# Patient Record
Sex: Male | Born: 1965 | Race: White | Hispanic: No | Marital: Married | Smoking: Current every day smoker
Health system: Southern US, Community
[De-identification: ages and names within clinical notes are randomized; demographics above are authoritative.]

## PROBLEM LIST (undated history)

## (undated) DIAGNOSIS — G4730 Sleep apnea, unspecified: Secondary | ICD-10-CM

## (undated) DIAGNOSIS — E119 Type 2 diabetes mellitus without complications: Secondary | ICD-10-CM

## (undated) HISTORY — PX: WISDOM TOOTH EXTRACTION: SHX21

## (undated) HISTORY — PX: NOSE SURGERY: SHX723

## (undated) HISTORY — DX: Sleep apnea, unspecified: G47.30

## (undated) HISTORY — PX: TONSILLECTOMY: SUR1361

---
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
## 2007-06-05 ENCOUNTER — Inpatient Hospital Stay: Payer: Self-pay | Admitting: Internal Medicine

## 2009-01-16 ENCOUNTER — Ambulatory Visit: Payer: Self-pay | Admitting: Unknown Physician Specialty

## 2017-11-09 ENCOUNTER — Encounter: Payer: Self-pay | Admitting: Family Medicine

## 2017-11-09 ENCOUNTER — Ambulatory Visit (INDEPENDENT_AMBULATORY_CARE_PROVIDER_SITE_OTHER): Payer: 59 | Admitting: Family Medicine

## 2017-11-09 VITALS — BP 160/78 | HR 89 | Temp 97.8°F | Resp 16 | Ht 73.0 in | Wt 360.0 lb

## 2017-11-09 DIAGNOSIS — F17210 Nicotine dependence, cigarettes, uncomplicated: Secondary | ICD-10-CM

## 2017-11-09 DIAGNOSIS — Z23 Encounter for immunization: Secondary | ICD-10-CM

## 2017-11-09 DIAGNOSIS — G4730 Sleep apnea, unspecified: Secondary | ICD-10-CM | POA: Insufficient documentation

## 2017-11-09 DIAGNOSIS — Z1211 Encounter for screening for malignant neoplasm of colon: Secondary | ICD-10-CM | POA: Diagnosis not present

## 2017-11-09 DIAGNOSIS — Z6841 Body Mass Index (BMI) 40.0 and over, adult: Secondary | ICD-10-CM | POA: Diagnosis not present

## 2017-11-09 DIAGNOSIS — R030 Elevated blood-pressure reading, without diagnosis of hypertension: Secondary | ICD-10-CM

## 2017-11-09 DIAGNOSIS — G4733 Obstructive sleep apnea (adult) (pediatric): Secondary | ICD-10-CM | POA: Diagnosis not present

## 2017-11-09 DIAGNOSIS — L409 Psoriasis, unspecified: Secondary | ICD-10-CM | POA: Insufficient documentation

## 2017-11-09 DIAGNOSIS — F17200 Nicotine dependence, unspecified, uncomplicated: Secondary | ICD-10-CM

## 2017-11-09 MED ORDER — TRIAMCINOLONE ACETONIDE 0.5 % EX OINT: 1.0000 "application " | TOPICAL_OINTMENT | Freq: Two times a day (BID) | CUTANEOUS | 2 refills | Status: DC

## 2017-11-09 MED ORDER — BUPROPION HCL ER (XL) 150 MG PO TB24: 150.0000 mg | ORAL_TABLET | Freq: Every day | ORAL | 2 refills | Status: DC

## 2017-11-09 NOTE — Patient Instructions (Signed)
Diet Recommendations for Diabetes   Starchy (carb) foods include: Bread, rice, pasta, potatoes, corn, crackers, bagels, muffins, all baked goods.  (Fruits, milk, and yogurt also have carbohydrate, but most of these foods will not spike your blood sugar as the starchy foods will.)  A few fruits do cause high blood sugars; use small portions of bananas (limit to 1/2 at a time), grapes, watermelon, and most tropical fruits.    Protein foods include: Meat, fish, poultry, eggs, dairy foods, and beans such as pinto and kidney beans (beans also provide carbohydrate).   1. Eat at least 3 meals and 1-2 snacks per day. Never go more than 4-5 hours while awake without eating. Eat breakfast within the first hour of getting up.   2. Limit starchy foods to TWO per meal and ONE per snack. ONE portion of a starchy  food is equal to the following:   - ONE slice of bread (or its equivalent, such as half of a hamburger bun).   - 1/2 cup of a "scoopable" starchy food such as potatoes or rice.   - 15 grams of carbohydrate as shown on food label.  3. Include at every meal: a protein food, a carb food, and vegetables and/or fruit.   - Obtain twice as many veg's as protein or carbohydrate foods for both lunch and dinner.   - Fresh or frozen veg's are best.   - Try to keep frozen veg's on hand for a quick vegetable serving.      Vaseline or Aquafor daily Prescription cream twice daily Psoriasis Psoriasis is a long-term (chronic) condition of skin inflammation. It occurs because your immune system causes skin cells to form too quickly. As a result, too many skin cells grow and create raised, red patches (plaques) that look silvery on your skin. Plaques may appear anywhere on your body. They can be any size or shape. Psoriasis can come and go. The condition varies from mild to very severe. It cannot be passed from one person to another (not contagious). What are the causes? The cause of psoriasis is not known, but  certain factors can make the condition worse. These include:  Damage or trauma to the skin, such as cuts, scrapes, sunburn, and dryness.  Lack of sunlight.  Certain medicines.  Alcohol.  Tobacco use.  Stress.  Infections caused by bacteria or viruses.  What increases the risk? This condition is more likely to develop in:  People with a family history of psoriasis.  People who are Caucasian.  People who are between the ages of 15-65 and 84-48 years old.  What are the signs or symptoms? There are five different types of psoriasis. You can have more than one type of psoriasis during your life. Types are:  Plaque.  Guttate.  Inverse.  Pustular.  Erythrodermic.  Each type of psoriasis has different symptoms.  Plaque psoriasis symptoms include red, raised plaques with a silvery white coating (scale). These plaques may be itchy. Your nails may be pitted and crumbly or fall off.  Guttate psoriasis symptoms include small red spots that often show up on your trunk, arms, and legs. These spots may develop after you have been sick, especially with strep throat.  Inverse psoriasis symptoms include plaques in your underarm area, under your breasts, or on your genitals, groin, or buttocks.  Pustular psoriasis symptoms include pus-filled bumps that are painful, red, and swollen on the palms of your hands or the soles of your feet. You also may feel  exhausted, feverish, weak, or have no appetite.  Erythrodermic psoriasis symptoms include bright red skin that may look burned. You may have a fast heartbeat and a body temperature that is too high or too low. You may be itchy or in pain.  How is this diagnosed? Your health care provider may suspect psoriasis based on your symptoms and family history. Your health care provider will also do a physical exam. This may include a procedure to remove a tissue sample (biopsy) for testing. You may also be referred to a health care provider who  specializes in skin diseases (dermatologist). How is this treated? There is no cure for this condition, but treatment can help manage it. Goals of treatment include:  Helping your skin heal.  Reducing itching and inflammation.  Slowing the growth of new skin cells.  Helping your immune system respond better to your skin.  Treatment varies, depending on the severity of your condition. Treatment may include:  Creams or ointments.  Ultraviolet ray exposure (light therapy). This may include natural sunlight or light therapy in a medical office.  Medicines (systemic therapy). These medicines can help your body better manage skin cell turnover and inflammation. They may be used along with light therapy or ointments. You may also get antibiotic medicines if you have an infection.  Follow these instructions at home: Tatums your skin as needed. Only use moisturizers that have been approved by your health care provider.  Apply cool compresses to the affected areas.  Do not scratch your skin. Lifestyle   Do not use tobacco products. This includes cigarettes, chewing tobacco, and e-cigarettes. If you need help quitting, ask your health care provider.  Drink little or no alcohol.  Try techniques for stress reduction, such as meditation or yoga.  Get exposure to the sun as told by your health care provider. Do not get sunburned.  Consider joining a psoriasis support group. Medicines  Take or use over-the-counter and prescription medicines only as told by your health care provider.  If you were prescribed an antibiotic, take or use it as told by your health care provider. Do not stop taking the antibiotic even if your condition starts to improve. General instructions  Keep a journal to help track what triggers an outbreak. Try to avoid any triggers.  See a counselor or social worker if feelings of sadness, frustration, and hopelessness about your condition are  interfering with your work and relationships.  Keep all follow-up visits as told by your health care provider. This is important. Contact a health care provider if:  Your pain gets worse.  You have increasing redness or warmth in the affected areas.  You have new or worsening pain or stiffness in your joints.  Your nails start to break easily or pull away from the nail bed.  You have a fever.  You feel depressed. This information is not intended to replace advice given to you by your health care provider. Make sure you discuss any questions you have with your health care provider. Document Released: 09/25/2000 Document Revised: 03/05/2016 Document Reviewed: 02/13/2015 Elsevier Interactive Patient Education  2018 [employer].

## 2017-11-09 NOTE — Progress Notes (Signed)
Patient: Dillon Bolton, Male    DOB: 14-Feb-1966, 52 y.o.   MRN: [id number] Visit Date: 11/09/2017  Today's Provider: Lavon Paganini, MD   Chief Complaint  Patient presents with  . Annual Exam   Subjective:    [place] is a 52 y.o. male who presents today for health maintenance , complete physical and to re-establish care. His LOV at [facility] was 04/11/2012. He feels well. He reports exercising none. He reports he is sleeping fairly well. He has sleep apnea, and uses CPAP. He reports he has gained weight, and the CPAP is becoming slightly ineffective.  Agrees to update flu and tetanus vaccines.  Agrees to have colonoscopy.  He would like to discuss a rash he has on his left forearm. He states this has been bothering him for "a couple years". The rash is itchy, which causes him to scratch and damage the skin. He has tried OTC anti-itch creams, without relief.  Wonders if there is some in his ears as they itch occasionally. -----------------------------------------------------------------  Tobacco use: "Wife is interested in me quitting" - states this when asked if he has thought about quitting.  Able to quit once in the past for a year - used Chantix - gave nausea. Wife got non hodgkins lymphoma and started back smoking at that time due to the stress. She is doing well now.  Thinks that he could quit with some help.  Has never used patches or gum.  Obesity: not currently exercising. Tries to walk with his wife, but states he is too slow and she laps him.  Know that he overeats and likes food too much.  States that he wants to lose weight for the sake of his health. Wife supports him in this.  Review of Systems  Constitutional: Negative.   HENT: Negative.   Eyes: Negative.   Respiratory: Positive for apnea. Negative for cough, choking, chest tightness, shortness of breath, wheezing and stridor.   Cardiovascular: Negative.   Gastrointestinal: Positive for  constipation. Negative for abdominal distention, abdominal pain, anal bleeding, blood in stool, diarrhea, nausea, rectal pain and vomiting.  Endocrine: Negative.   Genitourinary: Negative.   Musculoskeletal: Positive for arthralgias. Negative for back pain, gait problem, joint swelling, myalgias, neck pain and neck stiffness.  Skin: Positive for rash. Negative for color change, pallor and wound.  Allergic/Immunologic: Negative.   Neurological: Negative.   Hematological: Negative.   Psychiatric/Behavioral: Negative.     Social History      He  reports that he has been smoking.  He has been smoking about 1.25 packs per day. he has never used smokeless tobacco. He reports that he drinks alcohol. He reports that he does not use drugs.       Social History   Socioeconomic History  . Marital status: Married    Spouse name: Elana  . Number of children: None  . Years of education: 58  . Highest education level: Associate degree: academic program  Social Needs  . Financial resource strain: Not hard at all  . Food insecurity - worry: Never true  . Food insecurity - inability: Never true  . Transportation needs - medical: No  . Transportation needs - non-medical: No  Occupational History    Employer: OTHER    Comment: [employer]  Tobacco Use  . Smoking status: Current Every Day Smoker    Packs/day: 1.25  . Smokeless tobacco: Never Used  . Tobacco comment: started  smoking at age 71  Substance and Sexual Activity  . Alcohol use: Yes    Comment: occasional  . Drug use: No  . Sexual activity: Yes  Other Topics Concern  . None  Social History Narrative   Pt has 3 adopted children.    Past Medical History:  Diagnosis Date  . Sleep apnea      There are no active problems to display for this patient.   Past Surgical History:  Procedure Laterality Date  . NOSE SURGERY      Family History        Family Status  Relation Name Status  . Mother  Alive  . Father  Alive  . Sister  half sister Alive  . Brother half brother Alive  . Brother half brother Alive  . Neg Hx  (Not Specified)        His family history includes Colon cancer in his father; Hypertension in his brother and mother.     No Known Allergies  No current outpatient medications on file.   Patient Care Team: Virginia Crews, MD as PCP - General (Family Medicine)      Objective:   Vitals: BP (!) 160/78 (BP Location: Left Arm, Patient Position: Sitting, Cuff Size: Large)   Pulse 89   Temp 97.8 F (36.6 C) (Oral)   Resp 16   Ht 6\' 1"  (1.854 m)   Wt (!) 360 lb (163.3 kg)   SpO2 98%   BMI 47.50 kg/m    Vitals:   11/09/17 0910  BP: (!) 160/78  Pulse: 89  Resp: 16  Temp: 97.8 F (36.6 C)  TempSrc: Oral  SpO2: 98%  Weight: (!) 360 lb (163.3 kg)  Height: 6\' 1"  (1.854 m)     Physical Exam  Constitutional: He is oriented to person, place, and time. He appears well-developed and well-nourished. No distress.  HENT:  Head: Normocephalic and atraumatic.  Right Ear: External ear normal.  Left Ear: External ear normal.  Nose: Nose normal.  Mouth/Throat: Oropharynx is clear and moist.  Eyes: Conjunctivae and EOM are normal. Pupils are equal, round, and reactive to light. No scleral icterus.  Neck: Neck supple. No thyromegaly present.  Cardiovascular: Normal rate, regular rhythm, normal heart sounds and intact distal pulses.  No murmur heard. Pulmonary/Chest: Effort normal and breath sounds normal. No respiratory distress. He has no wheezes. He has no rales.  Abdominal: Soft. Bowel sounds are normal. He exhibits no distension. There is no tenderness. There is no rebound and no guarding.  Musculoskeletal: He exhibits no edema or deformity.  Lymphadenopathy:    He has no cervical adenopathy.  Neurological: He is alert and oriented to person, place, and time.  Skin: Skin is warm and dry.  Psoriasis over L forearm - red, scaly, excoriated  Psychiatric: He has a normal mood and affect.  His behavior is normal.  Vitals reviewed.    Depression Screen PHQ 2/9 Scores 11/09/2017  PHQ - 2 Score 0     Assessment & Plan:    Problem List Items Addressed This Visit      Respiratory   Sleep apnea    Currently using CPAP Will continue use Discussed that weight loss may help with sleep apnea If patient continues to feel that his sleep is no longer restorative, we could consider a new CPAP titration study        Musculoskeletal and Integument   Psoriasis    Rash on arms consistent with a plaque  of psoriasis Given that it only covers a small area of the body, will treat topically We will try triamcinolone ointment twice daily Discussed importance of moisturizers and as well Does not appear infected, but return precautions discussed        Other   Obesity - Primary    Long discussion regarding healthy weight management, diet and exercise Encouraged 30 min of exercise at least 5 days weekly Encouraged cutting back on portion sizes and carb intake Wellbutrin may also help with weight loss and carving control      Tobacco use disorder    5-6 min discussion regarding importance of cessation, health risks of continued smoking, and methods with which to quit Given previous side effects to Chantix (nausea), will try Buproprion instead Discussed possible side effects Will f/u in 1 month and consider dose titration      Elevated BP without diagnosis of hypertension    No previous diagnosis of HTN, not taking any meds Suspect being in a new place and seeing new MD today has caused some of this elevation Asymptomatic Recheck at upcoming CPE and consider medicaitons if still elevated at that time.       Other Visit Diagnoses    Colon cancer screening       Relevant Orders   Ambulatory referral to Gastroenterology   Flu vaccine need       Relevant Orders   Flu Vaccine QUAD 36+ mos IM (Completed)   Need for Tdap vaccination       Relevant Orders   Tdap vaccine  greater than or equal to 7yo IM (Completed)       Return in about 4 weeks (around 12/07/2017) for CPE.   The entirety of the information documented in the History of Present Illness, Review of Systems and Physical Exam were personally obtained by me. Portions of this information were initially documented by Raquel Sarna Ratchford, CMA and reviewed by me for thoroughness and accuracy.    Virginia Crews, MD, MPH [facility] 11/10/2017 11:38 AM

## 2017-11-10 DIAGNOSIS — R030 Elevated blood-pressure reading, without diagnosis of hypertension: Secondary | ICD-10-CM | POA: Insufficient documentation

## 2017-11-10 NOTE — Assessment & Plan Note (Signed)
Currently using CPAP Will continue use Discussed that weight loss may help with sleep apnea If patient continues to feel that his sleep is no longer restorative, we could consider a new CPAP titration study

## 2017-11-10 NOTE — Assessment & Plan Note (Signed)
Rash on arms consistent with a plaque of psoriasis Given that it only covers a small area of the body, will treat topically We will try triamcinolone ointment twice daily Discussed importance of moisturizers and as well Does not appear infected, but return precautions discussed

## 2017-11-10 NOTE — Assessment & Plan Note (Signed)
5-6 min discussion regarding importance of cessation, health risks of continued smoking, and methods with which to quit Given previous side effects to Chantix (nausea), will try Buproprion instead Discussed possible side effects Will f/u in 1 month and consider dose titration

## 2017-11-10 NOTE — Assessment & Plan Note (Signed)
No previous diagnosis of HTN, not taking any meds Suspect being in a new place and seeing new MD today has caused some of this elevation Asymptomatic Recheck at upcoming CPE and consider medicaitons if still elevated at that time.

## 2017-11-10 NOTE — Assessment & Plan Note (Signed)
Long discussion regarding healthy weight management, diet and exercise Encouraged 30 min of exercise at least 5 days weekly Encouraged cutting back on portion sizes and carb intake Wellbutrin may also help with weight loss and carving control

## 2017-11-24 ENCOUNTER — Encounter: Payer: Self-pay | Admitting: *Deleted

## 2017-12-03 ENCOUNTER — Other Ambulatory Visit: Payer: Self-pay

## 2017-12-03 DIAGNOSIS — Z1211 Encounter for screening for malignant neoplasm of colon: Secondary | ICD-10-CM

## 2017-12-07 ENCOUNTER — Other Ambulatory Visit: Payer: Self-pay

## 2017-12-07 DIAGNOSIS — Z1211 Encounter for screening for malignant neoplasm of colon: Secondary | ICD-10-CM

## 2017-12-28 ENCOUNTER — Encounter: Payer: 59 | Admitting: Family Medicine

## 2017-12-28 NOTE — Progress Notes (Deleted)
Patient: Dillon Bolton, Male    DOB: 1966/01/21, 53 y.o.   MRN: [id number] Visit Date: 12/28/2017  Today's Provider: Lavon Paganini, MD   I, Martha Clan, CMA, am acting as scribe for Lavon Paganini, MD.  No chief complaint on file.  Subjective:    Annual physical exam Dillon Bolton is a 52 y.o. male who presents today for health maintenance and complete physical. He feels {DESC; WELL/FAIRLY WELL/POORLY:18703}. He reports exercising ***. He reports he is sleeping {DESC; WELL/FAIRLY WELL/POORLY:18703}.  He has a colonoscopy scheduled for 01/06/2018. -----------------------------------------------------------------   Review of Systems  Social History      He  reports that he has been smoking cigarettes.  He started smoking about 32 years ago. He has been smoking about 1.25 packs per day. he has never used smokeless tobacco. He reports that he drinks about 0.6 - 1.2 oz of alcohol per week. He reports that he does not use drugs.       Social History   Socioeconomic History  . Marital status: Married    Spouse name: Dillon Bolton  . Number of children: 3  . Years of education: 23  . Highest education level: Associate degree: academic program  Social Needs  . Financial resource strain: Not hard at all  . Food insecurity - worry: Never true  . Food insecurity - inability: Never true  . Transportation needs - medical: No  . Transportation needs - non-medical: No  Occupational History  . Occupation: works on [employer]: [employer]    Comment: [employer]  Tobacco Use  . Smoking status: Current Every Day Smoker    Packs/day: 1.25    Types: Cigarettes    Start date: 61  . Smokeless tobacco: Never Used  . Tobacco comment: started smoking at age 20  Substance and Sexual Activity  . Alcohol use: Yes    Alcohol/week: 0.6 - 1.2 oz    Types: 1 - 2 Cans of beer per week    Comment: occasional  . Drug use: No  . Sexual activity: Yes    Partners: Female  Other  Topics Concern  . Not on file  Social History Narrative   Pt has 3 adopted children.    Past Medical History:  Diagnosis Date  . Sleep apnea      Patient Active Problem List   Diagnosis Date Noted  . Elevated BP without diagnosis of hypertension 11/10/2017  . Obesity 11/09/2017  . Tobacco use disorder 11/09/2017  . Psoriasis 11/09/2017  . Sleep apnea     Past Surgical History:  Procedure Laterality Date  . KNEE SURGERY Bilateral 1975   removed osteochondromas at age 2  . NOSE SURGERY     to help with OSA, opened up passages    Family History        Family Status  Relation Name Status  . Mother  Alive  . Father  Alive  . Sister half sister Alive  . Brother half brother Alive  . Brother half brother Alive  . Neg Hx  (Not Specified)        His family history includes Colon cancer in his father; Hypertension in his brother and mother. There is no history of Prostate cancer.      No Known Allergies   Current Outpatient Medications:  .  buPROPion (WELLBUTRIN XL) 150 MG 24 hr tablet, Take 1 tablet (150 mg total) by mouth daily., Disp: 30 tablet, Rfl:  2 .  triamcinolone ointment (KENALOG) 0.5 %, Apply 1 application topically 2 (two) times daily., Disp: 60 g, Rfl: 2   Patient Care Team: Virginia Crews, MD as PCP - General (Family Medicine)      Objective:   Vitals: There were no vitals taken for this visit.  There were no vitals filed for this visit.   Physical Exam   Depression Screen PHQ 2/9 Scores 11/09/2017  PHQ - 2 Score 0      Assessment & Plan:     Routine Health Maintenance and Physical Exam  Exercise Activities and Dietary recommendations Goals    None      Immunization History  Administered Date(s) Administered  . Influenza Split 08/04/2015  . Influenza,inj,Quad PF,6+ Mos 11/09/2017  . Tdap 11/09/2017    Health Maintenance  Topic Date Due  . HIV Screening  05/22/1981  . COLONOSCOPY  05/22/2016  . TETANUS/TDAP  11/10/2027   . INFLUENZA VACCINE  Completed     Discussed health benefits of physical activity, and encouraged him to engage in regular exercise appropriate for his age and condition.    --------------------------------------------------------------------

## 2018-01-06 ENCOUNTER — Ambulatory Visit
Admission: RE | Admit: 2018-01-06 | Discharge: 2018-01-06 | Disposition: A | Discharge status: Home / Self Care | Payer: 59 | Source: Ambulatory Visit | Attending: Gastroenterology | Admitting: Gastroenterology

## 2018-01-06 ENCOUNTER — Ambulatory Visit: Payer: 59 | Admitting: Anesthesiology

## 2018-01-06 ENCOUNTER — Other Ambulatory Visit: Payer: Self-pay

## 2018-01-06 ENCOUNTER — Encounter
Admission: RE | Disposition: A | Discharge status: Home / Self Care | Payer: Self-pay | Source: Ambulatory Visit | Attending: Gastroenterology

## 2018-01-06 ENCOUNTER — Encounter: Payer: Self-pay | Admitting: *Deleted

## 2018-01-06 DIAGNOSIS — Z800 Family history of malignant neoplasm of digestive organs: Secondary | ICD-10-CM | POA: Diagnosis not present

## 2018-01-06 DIAGNOSIS — Z79899 Other long term (current) drug therapy: Secondary | ICD-10-CM | POA: Insufficient documentation

## 2018-01-06 DIAGNOSIS — D122 Benign neoplasm of ascending colon: Secondary | ICD-10-CM

## 2018-01-06 DIAGNOSIS — Z6841 Body Mass Index (BMI) 40.0 and over, adult: Secondary | ICD-10-CM | POA: Insufficient documentation

## 2018-01-06 DIAGNOSIS — Z9989 Dependence on other enabling machines and devices: Secondary | ICD-10-CM | POA: Insufficient documentation

## 2018-01-06 DIAGNOSIS — D126 Benign neoplasm of colon, unspecified: Secondary | ICD-10-CM | POA: Diagnosis not present

## 2018-01-06 DIAGNOSIS — D124 Benign neoplasm of descending colon: Secondary | ICD-10-CM

## 2018-01-06 DIAGNOSIS — F17210 Nicotine dependence, cigarettes, uncomplicated: Secondary | ICD-10-CM | POA: Insufficient documentation

## 2018-01-06 DIAGNOSIS — G4730 Sleep apnea, unspecified: Secondary | ICD-10-CM | POA: Diagnosis not present

## 2018-01-06 DIAGNOSIS — K635 Polyp of colon: Secondary | ICD-10-CM | POA: Diagnosis not present

## 2018-01-06 DIAGNOSIS — D125 Benign neoplasm of sigmoid colon: Secondary | ICD-10-CM | POA: Insufficient documentation

## 2018-01-06 DIAGNOSIS — Z1211 Encounter for screening for malignant neoplasm of colon: Secondary | ICD-10-CM

## 2018-01-06 DIAGNOSIS — G4733 Obstructive sleep apnea (adult) (pediatric): Secondary | ICD-10-CM | POA: Insufficient documentation

## 2018-01-06 HISTORY — PX: COLONOSCOPY WITH PROPOFOL: SHX5780

## 2018-01-06 SURGERY — COLONOSCOPY WITH PROPOFOL
Anesthesia: General

## 2018-01-06 MED ORDER — PROPOFOL 500 MG/50ML IV EMUL
INTRAVENOUS | Status: DC | PRN
  Administered 2018-01-06: 150 ug/kg/min via INTRAVENOUS

## 2018-01-06 MED ORDER — PROPOFOL 10 MG/ML IV BOLUS
INTRAVENOUS | Status: AC
  Filled 2018-01-06: qty 40

## 2018-01-06 MED ORDER — SODIUM CHLORIDE 0.9 % IV SOLN
INTRAVENOUS | Status: DC
  Administered 2018-01-06: 09:00:00 via INTRAVENOUS

## 2018-01-06 MED ORDER — PROPOFOL 10 MG/ML IV BOLUS
INTRAVENOUS | Status: DC | PRN
  Administered 2018-01-06 (×2): 100 mg via INTRAVENOUS

## 2018-01-06 MED ORDER — LIDOCAINE HCL (CARDIAC) 20 MG/ML IV SOLN
INTRAVENOUS | Status: DC | PRN
  Administered 2018-01-06: 100 mg via INTRAVENOUS

## 2018-01-06 NOTE — H&P (Signed)
Jonathon Bellows, MD [address] [place], [place], [place], [address] [address], [place], [place], [place], [address] Phone: [phone]  Fax: [phone]  Primary Care Physician:  Virginia Crews, MD   Pre-Procedure History & Physical: HPI:  Dillon Bolton is a 52 y.o. male is here for an colonoscopy.   Past Medical History:  Diagnosis Date  . Sleep apnea     Past Surgical History:  Procedure Laterality Date  . KNEE SURGERY Bilateral 1975   removed osteochondromas at age 78  . NOSE SURGERY     to help with OSA, opened up passages  . TONSILLECTOMY    . WISDOM TOOTH EXTRACTION      Prior to Admission medications   Medication Sig Start Date End Date Taking? Authorizing Provider  buPROPion (WELLBUTRIN XL) 150 MG 24 hr tablet Take 1 tablet (150 mg total) by mouth daily. 11/09/17  Yes Bacigalupo, Dionne Bucy, MD  triamcinolone ointment (KENALOG) 0.5 % Apply 1 application topically 2 (two) times daily. 11/09/17  Yes Virginia Crews, MD    Allergies as of 12/07/2017  . (No Known Allergies)    Family History  Problem Relation Age of Onset  . Hypertension Mother   . Colon cancer Father   . Hypertension Brother   . Prostate cancer Neg Hx     Social History   Socioeconomic History  . Marital status: Married    Spouse name: Elana  . Number of children: 3  . Years of education: 53  . Highest education level: Associate degree: academic program  Occupational History  . 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 Financial resource strain: Not hard at all  . Food insecurity:    Worry: Never true    Inability: Never true  . Transportation needs:    Medical: No    Non-medical: No  Tobacco Use  . Smoking status: Current Every Day Smoker    Packs/day: 0.25    Types: Cigarettes    Start date: 86  . Smokeless tobacco: Never Used  . Tobacco comment: started smoking at age 48  Substance and Sexual Activity  .  Alcohol use: Yes    Alcohol/week: 0.6 - 1.2 oz    Types: 1 - 2 Cans of beer per week    Comment: occasional  . Drug use: No  . Sexual activity: Yes    Partners: Female  Lifestyle  . Physical activity:    Days per week: 0 days    Minutes per session: 0 min  . Stress: Not on file  Relationships  . Social connections:    Talks on phone: Not on file    Gets together: Not on file    Attends religious service: Not on file    Active member of club or organization: Not on file    Attends meetings of clubs or organizations: Not on file    Relationship status: Not on file  . Intimate partner violence:    Fear of current or ex partner: Not on file    Emotionally abused: Not on file    Physically abused: Not on file    Forced sexual activity: Not on file  Other Topics Concern  . Not on file  Social History Narrative   Pt has 3 adopted children.    Review of Systems: See HPI, otherwise negative ROS  Physical Exam: BP 129/77   Pulse  92   Temp (!) 97.3 F (36.3 C) (Tympanic)   Resp 20   Ht 6\' 1"  (1.854 m)   Wt (!) 342 lb (155.1 kg)   SpO2 98%   BMI 45.12 kg/m  General:   Alert,  pleasant and cooperative in NAD Head:  Normocephalic and atraumatic. Neck:  Supple; no masses or thyromegaly. Lungs:  Clear throughout to auscultation, normal respiratory effort.    Heart:  +S1, +S2, Regular rate and rhythm, No edema. Abdomen:  Soft, nontender and nondistended. Normal bowel sounds, without guarding, and without rebound.   Neurologic:  Alert and  oriented x4;  grossly normal neurologically.  Impression/Plan: Dillon Bolton is here for an colonoscopy to be performed for Screening colonoscopy average risk   Risks, benefits, limitations, and alternatives regarding  colonoscopy have been reviewed with the patient.  Questions have been answered.  All parties agreeable.   Jonathon Bellows, MD  01/06/2018, 9:39 AM

## 2018-01-06 NOTE — Anesthesia Postprocedure Evaluation (Signed)
Anesthesia Post Note  Patient: Dillon Bolton  Procedure(s) Performed: COLONOSCOPY WITH PROPOFOL (N/A )  Patient location during evaluation: Endoscopy Anesthesia Type: General Level of consciousness: awake and alert Pain management: pain level controlled Vital Signs Assessment: post-procedure vital signs reviewed and stable Respiratory status: spontaneous breathing, nonlabored ventilation, respiratory function stable and patient connected to nasal cannula oxygen Cardiovascular status: blood pressure returned to baseline and stable Postop Assessment: no apparent nausea or vomiting Anesthetic complications: no     Last Vitals:  Vitals:   01/06/18 1050 01/06/18 1100  BP: 113/85 (!) 99/41  Pulse: 83 79  Resp: (!) 21 (!) 23  Temp:    SpO2: 98% 97%    Last Pain:  Vitals:   01/06/18 0854  TempSrc: Tympanic  PainSc: 0-No pain                 Annakate Soulier S

## 2018-01-06 NOTE — Anesthesia Post-op Follow-up Note (Signed)
Anesthesia QCDR form completed.        

## 2018-01-06 NOTE — Transfer of Care (Signed)
Immediate Anesthesia Transfer of Care Note  Patient: Dillon Bolton  Procedure(s) Performed: COLONOSCOPY WITH PROPOFOL (N/A )  Patient Location: PACU  Anesthesia Type:General  Level of Consciousness: awake, alert  and oriented  Airway & Oxygen Therapy: Patient Spontanous Breathing  Post-op Assessment: Report given to RN and Post -op Vital signs reviewed and stable  Post vital signs: Reviewed and stable  Last Vitals:  Vitals Value Taken Time  BP    Temp    Pulse 109 01/06/2018 10:29 AM  Resp 18 01/06/2018 10:29 AM  SpO2 97 % 01/06/2018 10:29 AM  Vitals shown include unvalidated device data.  Last Pain:  Vitals:   01/06/18 0854  TempSrc: Tympanic  PainSc: 0-No pain         Complications: No apparent anesthesia complications

## 2018-01-06 NOTE — Anesthesia Preprocedure Evaluation (Addendum)
Anesthesia Evaluation  Patient identified by MRN, date of birth, ID band Patient awake    Reviewed: Allergy & Precautions, NPO status , Patient's Chart, lab work & pertinent test results, reviewed documented beta blocker date and time   Airway Mallampati: III  TM Distance: >3 FB     Dental  (+) Chipped   Pulmonary sleep apnea and Continuous Positive Airway Pressure Ventilation , Current Smoker,           Cardiovascular      Neuro/Psych    GI/Hepatic   Endo/Other  Morbid obesity  Renal/GU      Musculoskeletal   Abdominal   Peds  Hematology   Anesthesia Other Findings   Reproductive/Obstetrics                            Anesthesia Physical Anesthesia Plan  ASA: III  Anesthesia Plan: General   Post-op Pain Management:    Induction: Intravenous  PONV Risk Score and Plan:   Airway Management Planned:   Additional Equipment:   Intra-op Plan:   Post-operative Plan:   Informed Consent: I have reviewed the patients History and Physical, chart, labs and discussed the procedure including the risks, benefits and alternatives for the proposed anesthesia with the patient or authorized representative who has indicated his/her understanding and acceptance.     Plan Discussed with: CRNA  Anesthesia Plan Comments:         Anesthesia Quick Evaluation

## 2018-01-06 NOTE — Op Note (Signed)
[facility] Patient Name: PARMINDER CUPPLES Procedure Date: 01/06/2018 9:45 AM MRN: [id number] Account #: [id number] Date of Birth: 1966-08-20 Admit Type: Outpatient Age: 52 Room: [facility] ENDO ROOM 4 Gender: Male Note Status: Finalized Procedure:            Colonoscopy Indications:          Screening for colorectal malignant neoplasm Providers:            Jonathon Bellows MD, MD Referring MD:         Dionne Bucy. Bacigalupo (Referring MD) Medicines:            Monitored Anesthesia Care Complications:        No immediate complications. Procedure:            Pre-Anesthesia Assessment:                       - Prior to the procedure, a History and Physical was                        performed, and patient medications, allergies and                        sensitivities were reviewed. The patient's tolerance of                        previous anesthesia was reviewed.                       - The risks and benefits of the procedure and the                        sedation options and risks were discussed with the                        patient. All questions were answered and informed                        consent was obtained.                       - ASA Grade Assessment: III - A patient with severe                        systemic disease.                       After obtaining informed consent, the colonoscope was                        passed under direct vision. Throughout the procedure,                        the patient's blood pressure, pulse, and oxygen                        saturations were monitored continuously. The                        Colonoscope was introduced through the anus and  advanced to the the cecum, identified by the                        appendiceal orifice, IC valve and transillumination.                        The colonoscopy was performed with ease. The patient                        tolerated the procedure well. The  quality of the bowel                        preparation was adequate. Findings:      The perianal and digital rectal examinations were normal.      Two sessile polyps were found in the descending colon. The polyps were 5       to 7 mm in size. These polyps were removed with a cold snare. Resection       and retrieval were complete.      A 3 mm polyp was found in the ascending colon. The polyp was sessile.       The polyp was removed with a cold biopsy forceps. Resection and       retrieval were complete.      A 8 mm polyp was found in the sigmoid colon. The polyp was sessile. The       polyp was removed with a hot snare. Resection and retrieval were       complete.      Two sessile polyps were found in the sigmoid colon. The polyps were 5 to       7 mm in size. These polyps were removed with a cold snare. Resection and       retrieval were complete.      The exam was otherwise without abnormality on direct and retroflexion       views. Impression:           - Two 5 to 7 mm polyps in the descending colon, removed                        with a cold snare. Resected and retrieved.                       - One 3 mm polyp in the ascending colon, removed with a                        cold biopsy forceps. Resected and retrieved.                       - One 8 mm polyp in the sigmoid colon, removed with a                        hot snare. Resected and retrieved.                       - Two 5 to 7 mm polyps in the sigmoid colon, removed                        with a cold snare. Resected and retrieved.                       -  The examination was otherwise normal on direct and                        retroflexion views. Recommendation:       - Discharge patient to home (with escort).                       - Resume previous diet.                       - Continue present medications.                       - Await pathology results.                       - Repeat colonoscopy in 3 years for  surveillance. Procedure Code(s):    --- Professional ---                       [phone], Colonoscopy, flexible; with removal of tumor(s),                        polyp(s), or other lesion(s) by snare technique                       45380, 20, Colonoscopy, flexible; with biopsy, single                        or multiple Diagnosis Code(s):    --- Professional ---                       Z12.11, Encounter for screening for malignant neoplasm                        of colon                       D12.4, Benign neoplasm of descending colon                       D12.5, Benign neoplasm of sigmoid colon                       D12.2, Benign neoplasm of ascending colon CPT copyright 2016 American Medical Association. All rights reserved. The codes documented in this report are preliminary and upon coder review may  be revised to meet current compliance requirements. Jonathon Bellows, MD Jonathon Bellows MD, MD 01/06/2018 10:26:12 AM This report has been signed electronically. Number of Addenda: 0 Note Initiated On: 01/06/2018 9:45 AM Scope Withdrawal Time: 0 hours 17 minutes 25 seconds  Total Procedure Duration: 0 hours 20 minutes 47 seconds       [facility]

## 2018-01-07 LAB — SURGICAL PATHOLOGY

## 2018-01-09 ENCOUNTER — Encounter: Payer: Self-pay | Admitting: Gastroenterology

## 2018-01-10 ENCOUNTER — Encounter: Payer: Self-pay | Admitting: Gastroenterology

## 2018-01-10 ENCOUNTER — Ambulatory Visit (INDEPENDENT_AMBULATORY_CARE_PROVIDER_SITE_OTHER): Payer: 59 | Admitting: Family Medicine

## 2018-01-10 VITALS — BP 126/74 | HR 121 | Temp 99.2°F | Resp 16 | Wt 351.0 lb

## 2018-01-10 DIAGNOSIS — J101 Influenza due to other identified influenza virus with other respiratory manifestations: Secondary | ICD-10-CM

## 2018-01-10 LAB — POCT INFLUENZA A/B
Influenza A, POC: POSITIVE — AB
Influenza B, POC: NEGATIVE

## 2018-01-10 MED ORDER — OSELTAMIVIR PHOSPHATE 75 MG PO CAPS: 75.0000 mg | ORAL_CAPSULE | Freq: Two times a day (BID) | ORAL | 0 refills | Status: AC

## 2018-01-10 NOTE — Progress Notes (Signed)
Patient: Dillon Bolton Male    DOB: May 26, 1966   52 y.o.   MRN: [id number] Visit Date: 01/10/2018  Today's Provider: Lavon Paganini, MD   I, Martha Clan, CMA, am acting as scribe for Lavon Paganini, MD.  Chief Complaint  Patient presents with  . URI   Subjective:    URI   This is a new problem. Episode onset: x 2 days. The problem has been waxing and waning. Maximum temperature: no documented temperature, but is c/o chills and body aches. Associated symptoms include abdominal pain, congestion, coughing, diarrhea, headaches, joint pain, nausea, rhinorrhea, sneezing and a sore throat. Pertinent negatives include no chest pain, ear pain, neck pain, plugged ear sensation, sinus pain, swollen glands, vomiting or wheezing.     No Known Allergies   Current Outpatient Medications:  .  buPROPion (WELLBUTRIN XL) 150 MG 24 hr tablet, Take 1 tablet (150 mg total) by mouth daily., Disp: 30 tablet, Rfl: 2 .  triamcinolone ointment (KENALOG) 0.5 %, Apply 1 application topically 2 (two) times daily. (Patient not taking: Reported on 01/10/2018), Disp: 60 g, Rfl: 2  Review of Systems  HENT: Positive for congestion, rhinorrhea, sneezing and sore throat. Negative for ear pain and sinus pain.   Respiratory: Positive for cough. Negative for wheezing.   Cardiovascular: Negative for chest pain.  Gastrointestinal: Positive for abdominal pain, diarrhea and nausea. Negative for vomiting.  Musculoskeletal: Positive for joint pain. Negative for neck pain.  Neurological: Positive for headaches.    Social History   Tobacco Use  . Smoking status: Current Every Day Smoker    Packs/day: 0.25    Types: Cigarettes    Start date: 47  . Smokeless tobacco: Never Used  . Tobacco comment: started smoking at age 21  Substance Use Topics  . Alcohol use: Yes    Alcohol/week: 0.6 - 1.2 oz    Types: 1 - 2 Cans of beer per week    Comment: occasional   Objective:   BP 126/74 (BP Location: Left  Arm, Patient Position: Sitting, Cuff Size: Large)   Pulse (!) 121   Temp 99.2 F (37.3 C) (Oral)   Resp 16   Wt (!) 351 lb (159.2 kg)   SpO2 95%   BMI 46.31 kg/m  Vitals:   01/10/18 1456  BP: 126/74  Pulse: (!) 121  Resp: 16  Temp: 99.2 F (37.3 C)  TempSrc: Oral  SpO2: 95%  Weight: (!) 351 lb (159.2 kg)     Physical Exam  Constitutional: He is oriented to person, place, and time. He appears well-developed and well-nourished. No distress.  HENT:  Head: Normocephalic and atraumatic.  Right Ear: External ear normal.  Left Ear: External ear normal.  Nose: Nose normal. Right sinus exhibits no maxillary sinus tenderness and no frontal sinus tenderness. Left sinus exhibits no maxillary sinus tenderness and no frontal sinus tenderness.  Mouth/Throat: Uvula is midline and mucous membranes are normal. Posterior oropharyngeal erythema present. No oropharyngeal exudate.  Eyes: Conjunctivae are normal. No scleral icterus.  Neck: Neck supple. No thyromegaly present.  Cardiovascular: Regular rhythm and normal pulses. Tachycardia present.  No murmur heard. Pulmonary/Chest: Effort normal and breath sounds normal. No respiratory distress. He has no wheezes. He has no rales.  Abdominal: Soft. He exhibits no distension. There is no tenderness.  Musculoskeletal: He exhibits no edema.  Lymphadenopathy:    He has no cervical adenopathy.  Neurological: He is alert and oriented to person, place, and  time.  Skin: Skin is warm and dry. No rash noted.  Psychiatric: He has a normal mood and affect. His behavior is normal.  Vitals reviewed.   Results for orders placed or performed in visit on 01/10/18  POCT Influenza A/B  Result Value Ref Range   Influenza A, POC Positive (A) Negative   Influenza B, POC Negative Negative      Assessment & Plan:      1. Influenza A - Flu A positive  - discussed pros and cons of Tamiflu and patient decides to try it - Rx for tamiflu sent to pharmacy -  symptomatic management, natural course, and return precautions discussed - POCT Influenza A/B    Meds ordered this encounter  Medications  . oseltamivir (TAMIFLU) 75 MG capsule    Sig: Take 1 capsule (75 mg total) by mouth 2 (two) times daily for 5 days.    Dispense:  10 capsule    Refill:  0     Return if symptoms worsen or fail to improve.   The entirety of the information documented in the History of Present Illness, Review of Systems and Physical Exam were personally obtained by me. Portions of this information were initially documented by Raquel Sarna Ratchford, CMA and reviewed by me for thoroughness and accuracy.    Virginia Crews, MD, MPH [facility] 01/10/2018 3:47 PM

## 2018-01-10 NOTE — Patient Instructions (Signed)

## 2018-02-05 ENCOUNTER — Other Ambulatory Visit: Payer: Self-pay | Admitting: Family Medicine

## 2018-02-07 NOTE — Telephone Encounter (Signed)
Med refilled.  Let's try to get him in for f/u appt.  Virginia Crews, MD, MPH [facility] 02/07/2018 10:41 AM

## 2018-02-07 NOTE — Telephone Encounter (Signed)
Established care on 11/09/2017. Was prescribed Wellbutrin for tobacco abuse. He no showed his FU appointment.

## 2018-02-08 NOTE — Telephone Encounter (Signed)
Patient aware wellbutrin refilled.  Follow up appointment made.  Patient aware.

## 2018-03-03 ENCOUNTER — Encounter: Payer: Self-pay | Admitting: Family Medicine

## 2018-03-03 ENCOUNTER — Ambulatory Visit (INDEPENDENT_AMBULATORY_CARE_PROVIDER_SITE_OTHER): Payer: 59 | Admitting: Family Medicine

## 2018-03-03 VITALS — BP 128/82 | HR 69 | Temp 97.7°F | Resp 20 | Ht 73.0 in | Wt 341.0 lb

## 2018-03-03 DIAGNOSIS — F17200 Nicotine dependence, unspecified, uncomplicated: Secondary | ICD-10-CM | POA: Diagnosis not present

## 2018-03-03 DIAGNOSIS — Z6841 Body Mass Index (BMI) 40.0 and over, adult: Secondary | ICD-10-CM | POA: Diagnosis not present

## 2018-03-03 DIAGNOSIS — G4733 Obstructive sleep apnea (adult) (pediatric): Secondary | ICD-10-CM

## 2018-03-03 MED ORDER — BUPROPION HCL ER (XL) 300 MG PO TB24: 300.0000 mg | ORAL_TABLET | Freq: Every day | ORAL | 2 refills | Status: DC

## 2018-03-03 NOTE — Assessment & Plan Note (Signed)
Patient has lost 10 pounds in the last month, but before that had gained a few pounds His weight seems to fluctuate within a 10 pound range Encourage diet and exercise again Increased Wellbutrin may also help with weight loss and craving control

## 2018-03-03 NOTE — Assessment & Plan Note (Signed)
Well controlled with CPAP, but power button is broken, mask does not hold pressure, etc He has gained and lost weight since last sleep study Will refer for repeat sleep study with fitting of new mask and get new supplies

## 2018-03-03 NOTE — Progress Notes (Signed)
Patient: Dillon Bolton Male    DOB: 03-09-1966   52 y.o.   MRN: [id number] Visit Date: 03/03/2018  Today's Provider: Lavon Paganini, MD   I, Martha Clan, CMA, am acting as scribe for Lavon Paganini, MD.  Chief Complaint  Patient presents with  . Nicotine Dependence  . Obesity  . Sleep Apnea   Subjective:    HPI     Follow up for Tobacco Abuse  The patient was last seen for this 4 months ago. Changes made at last visit include adding Wellbutrin.  He reports good compliance with treatment. He feels that condition is Improved. Is now smoking 1 PPD, which is improved form 1.5-2 PPD. He is not having side effects.  States that he had bad nausea with Chantix in the past  ------------------------------------------------------------------------------------  Follow up for Obesity  The patient was last seen for this 4 months ago. Changes made at last visit include encouraging 30 minutes of exercise 5 days per week, decreasing portion sizes and carbs. Wellbutrin was also hoped to improve cravings.  He reports good compliance with treatment. He states he has cut back on carbs and portion sizes, but is not exercising.  Wt Readings from Last 3 Encounters:  03/03/18 (!) 341 lb (154.7 kg)  01/10/18 (!) 351 lb (159.2 kg)  01/06/18 (!) 342 lb (155.1 kg)   ------------------------------------------------------------------------------------  Sleep Apnea Pt states he is using a CPAP that he has had for 14-15 years. He feels rested, denies daytime somnolence, witnessed apnea. He is concerned because the mask is "beat up", and will sometime slip. He is requesting an order for a new CPAP.   No Known Allergies   Current Outpatient Medications:  .  buPROPion (WELLBUTRIN XL) 150 MG 24 hr tablet, TAKE 1 TABLET BY MOUTH EVERY DAY, Disp: 30 tablet, Rfl: 2 .  triamcinolone ointment (KENALOG) 0.5 %, Apply 1 application topically 2 (two) times daily. (Patient not taking:  Reported on 01/10/2018), Disp: 60 g, Rfl: 2  Review of Systems  Constitutional: Negative for activity change, appetite change, chills, diaphoresis, fatigue, fever and unexpected weight change.  Respiratory: Positive for apnea. Negative for cough, shortness of breath and wheezing.   Cardiovascular: Negative for chest pain, palpitations and leg swelling.  Musculoskeletal: Positive for arthralgias.    Social History   Tobacco Use  . Smoking status: Current Every Day Smoker    Packs/day: 1.00    Types: Cigarettes    Start date: 46  . Smokeless tobacco: Never Used  . Tobacco comment: started smoking at age 24. Is smoking 1 PPD, which is decreased from 1.5-2 PPD  Substance Use Topics  . Alcohol use: Yes    Alcohol/week: 0.6 - 1.2 oz    Types: 1 - 2 Cans of beer per week    Comment: occasional   Objective:   BP 128/82 (BP Location: Left Arm, Patient Position: Sitting, Cuff Size: Large)   Pulse 69   Temp 97.7 F (36.5 C) (Oral)   Resp 20   Ht 6\' 1"  (1.854 m)   Wt (!) 341 lb (154.7 kg)   SpO2 96%   BMI 44.99 kg/m  Vitals:   03/03/18 0943  BP: 128/82  Pulse: 69  Resp: 20  Temp: 97.7 F (36.5 C)  TempSrc: Oral  SpO2: 96%  Weight: (!) 341 lb (154.7 kg)  Height: 6\' 1"  (1.854 m)     Physical Exam  Constitutional: He is oriented to person, place, and  time. He appears well-developed and well-nourished. No distress.  HENT:  Head: Normocephalic and atraumatic.  Eyes: Conjunctivae are normal. No scleral icterus.  Neck: Neck supple. No thyromegaly present.  Cardiovascular: Normal rate, regular rhythm, normal heart sounds and intact distal pulses.  No murmur heard. Pulmonary/Chest: Effort normal and breath sounds normal. No respiratory distress. He has no wheezes. He has no rales.  Musculoskeletal: He exhibits no edema or deformity.  Lymphadenopathy:    He has no cervical adenopathy.  Neurological: He is alert and oriented to person, place, and time.  Skin: Skin is warm and  dry. Capillary refill takes less than 2 seconds.  Psychiatric: He has a normal mood and affect. His behavior is normal.  Vitals reviewed.      Assessment & Plan:   Problem List Items Addressed This Visit      Respiratory   Sleep apnea    Well controlled with CPAP, but power button is broken, mask does not hold pressure, etc He has gained and lost weight since last sleep study Will refer for repeat sleep study with fitting of new mask and get new supplies      Relevant Orders   Ambulatory referral to Sleep Studies     Other   Obesity - Primary    Patient has lost 10 pounds in the last month, but before that had gained a few pounds His weight seems to fluctuate within a 10 pound range Encourage diet and exercise again Increased Wellbutrin may also help with weight loss and craving control      Tobacco use disorder    3 to 5-minute discussion regarding importance of cessation, health risks of continued smoking Patient is doing well on bupropion, but not getting enough craving relief at this time Increase Wellbutrin to 300 mg daily Discussed possible side effects Follow-up at upcoming physical          Return in about 1 month (around 03/31/2018) for CPE.   The entirety of the information documented in the History of Present Illness, Review of Systems and Physical Exam were personally obtained by me. Portions of this information were initially documented by Raquel Sarna Ratchford, CMA and reviewed by me for thoroughness and accuracy.    Virginia Crews, MD, MPH [facility] 03/03/2018 10:16 AM

## 2018-03-03 NOTE — Patient Instructions (Signed)
Coping with Quitting Smoking Quitting smoking is a physical and mental challenge. You will face cravings, withdrawal symptoms, and temptation. Before quitting, work with your health care provider to make a plan that can help you cope. Preparation can help you quit and keep you from giving in. How can I cope with cravings? Cravings usually last for 5-10 minutes. If you get through it, the craving will pass. Consider taking the following actions to help you cope with cravings:  Keep your mouth busy: ? Chew sugar-free gum. ? Suck on hard candies or a straw. ? Brush your teeth.  Keep your hands and body busy: ? Immediately change to a different activity when you feel a craving. ? Squeeze or play with a ball. ? Do an activity or a hobby, like making bead jewelry, practicing needlepoint, or working with wood. ? Mix up your normal routine. ? Take a short exercise break. Go for a quick walk or run up and down stairs. ? Spend time in public places where smoking is not allowed.  Focus on doing something kind or helpful for someone else.  Call a friend or family member to talk during a craving.  Join a support group.  Call a quit line, such as 1-800-QUIT-NOW.  Talk with your health care provider about medicines that might help you cope with cravings and make quitting easier for you.  How can I deal with withdrawal symptoms? Your body may experience negative effects as it tries to get used to not having nicotine in the system. These effects are called withdrawal symptoms. They may include:  Feeling hungrier than normal.  Trouble concentrating.  Irritability.  Trouble sleeping.  Feeling depressed.  Restlessness and agitation.  Craving a cigarette.  To manage withdrawal symptoms:  Avoid places, people, and activities that trigger your cravings.  Remember why you want to quit.  Get plenty of sleep.  Avoid coffee and other caffeinated drinks. These may worsen some of your  symptoms.  How can I handle social situations? Social situations can be difficult when you are quitting smoking, especially in the first few weeks. To manage this, you can:  Avoid parties, bars, and other social situations where people might be smoking.  Avoid alcohol.  Leave right away if you have the urge to smoke.  Explain to your family and friends that you are quitting smoking. Ask for understanding and support.  Plan activities with friends or family where smoking is not an option.  What are some ways I can cope with stress? Wanting to smoke may cause stress, and stress can make you want to smoke. Find ways to manage your stress. Relaxation techniques can help. For example:  Breathe slowly and deeply, in through your nose and out through your mouth.  Listen to soothing, relaxing music.  Talk with a family member or friend about your stress.  Light a candle.  Soak in a bath or take a shower.  Think about a peaceful place.  What are some ways I can prevent weight gain? Be aware that many people gain weight after they quit smoking. However, not everyone does. To keep from gaining weight, have a plan in place before you quit and stick to the plan after you quit. Your plan should include:  Having healthy snacks. When you have a craving, it may help to: ? Eat plain popcorn, crunchy carrots, celery, or other cut vegetables. ? Chew sugar-free gum.  Changing how you eat: ? Eat small portion sizes at meals. ?   Eat 4-6 small meals throughout the day instead of 1-2 large meals a day. ? Be mindful when you eat. Do not watch television or do other things that might distract you as you eat.  Exercising regularly: ? Make time to exercise each day. If you do not have time for a long workout, do short bouts of exercise for 5-10 minutes several times a day. ? Do some form of strengthening exercise, like weight lifting, and some form of aerobic exercise, like running or  swimming.  Drinking plenty of water or other low-calorie or no-calorie drinks. Drink 6-8 glasses of water daily, or as much as instructed by your health care provider.  Summary  Quitting smoking is a physical and mental challenge. You will face cravings, withdrawal symptoms, and temptation to smoke again. Preparation can help you as you go through these challenges.  You can cope with cravings by keeping your mouth busy (such as by chewing gum), keeping your body and hands busy, and making calls to family, friends, or a helpline for people who want to quit smoking.  You can cope with withdrawal symptoms by avoiding places where people smoke, avoiding drinks with caffeine, and getting plenty of rest.  Ask your health care provider about the different ways to prevent weight gain, avoid stress, and handle social situations. This information is not intended to replace advice given to you by your health care provider. Make sure you discuss any questions you have with your health care provider. Document Released: 09/25/2016 Document Revised: 09/25/2016 Document Reviewed: 09/25/2016 Elsevier Interactive Patient Education  2018 Elsevier Inc.  

## 2018-03-03 NOTE — Assessment & Plan Note (Signed)
3 to 5-minute discussion regarding importance of cessation, health risks of continued smoking Patient is doing well on bupropion, but not getting enough craving relief at this time Increase Wellbutrin to 300 mg daily Discussed possible side effects Follow-up at upcoming physical

## 2018-05-03 ENCOUNTER — Ambulatory Visit (INDEPENDENT_AMBULATORY_CARE_PROVIDER_SITE_OTHER): Payer: 59 | Admitting: Family Medicine

## 2018-05-03 ENCOUNTER — Encounter: Payer: Self-pay | Admitting: Family Medicine

## 2018-05-03 VITALS — BP 122/74 | HR 87 | Temp 97.9°F | Resp 20 | Ht 73.0 in | Wt 341.0 lb

## 2018-05-03 DIAGNOSIS — Z6841 Body Mass Index (BMI) 40.0 and over, adult: Secondary | ICD-10-CM | POA: Diagnosis not present

## 2018-05-03 DIAGNOSIS — Z0000 Encounter for general adult medical examination without abnormal findings: Secondary | ICD-10-CM

## 2018-05-03 DIAGNOSIS — F17200 Nicotine dependence, unspecified, uncomplicated: Secondary | ICD-10-CM

## 2018-05-03 NOTE — Progress Notes (Signed)
Patient: Dillon Bolton, Male    DOB: 04-26-1966, 52 y.o.   MRN: [id number] Visit Date: 05/03/2018  Today's Provider: Lavon Paganini, MD   I, Martha Clan, CMA, am acting as scribe for Lavon Paganini, MD.  Chief Complaint  Patient presents with  . Annual Exam   Subjective:    Annual physical exam Dillon Bolton is a 52 y.o. male who presents today for health maintenance and complete physical. He feels fairly well. He is c/o congestion. He reports exercising now that his riding lawn mower stopped working. Is having to push mow his one acre yard. He reports he is sleeping poorly. His CPAP is not working well.   Last colonoscopy- 01/06/2017- 2 hyperplastic polyps, 2 tubular adenoma, 1 sessile serrated adenoma. Repeat 3 years per Dr. Vicente Males.  Tobacco use: Cutting back to a few cigarettes per day.  Still smoking more at work.  Taking Wellbutrin which seems to help with cravings. -----------------------------------------------------------------   Review of Systems  Constitutional: Negative.   HENT: Positive for congestion. Negative for dental problem, drooling, ear discharge, ear pain, facial swelling, hearing loss, mouth sores, nosebleeds, postnasal drip, rhinorrhea, sinus pressure, sinus pain, sneezing, sore throat, tinnitus, trouble swallowing and voice change.   Eyes: Negative.   Respiratory: Positive for apnea. Negative for cough, choking, chest tightness, shortness of breath, wheezing and stridor.   Cardiovascular: Negative.   Gastrointestinal: Negative.   Endocrine: Negative.   Genitourinary: Negative.   Musculoskeletal: Positive for arthralgias. Negative for back pain, gait problem, joint swelling, myalgias, neck pain and neck stiffness.  Skin: Negative.   Allergic/Immunologic: Negative.   Neurological: Negative.   Hematological: Negative.   Psychiatric/Behavioral: Negative.     Social History      He  reports that he has been smoking cigarettes.  He started  smoking about 32 years ago. He has been smoking about 0.50 packs per day. He has never used smokeless tobacco. He reports that he drinks about 0.6 - 1.2 oz of alcohol per week. He reports that he does not use drugs.       Social History   Socioeconomic History  . Marital status: Married    Spouse name: Elana  . Number of children: 3  . Years of education: 31  . Highest education level: Associate degree: academic program  Occupational History  . Occupation: works on [employer]: [place]: [place]  . Financial resource strain: Not hard at all  . Food insecurity:    Worry: Never true    Inability: Never true  . Transportation needs:    Medical: No    Non-medical: No  Tobacco Use  . Smoking status: Current Every Day Smoker    Packs/day: 0.50    Types: Cigarettes    Start date: 4  . Smokeless tobacco: Never Used  . Tobacco comment: started smoking at age 82. Is smoking 0.25-0.75 PPD, which is decreased from 1.5-2 PPD  Substance and Sexual Activity  . Alcohol use: Yes    Alcohol/week: 0.6 - 1.2 oz    Types: 1 - 2 Cans of beer per week    Comment: occasional  . Drug use: No  . Sexual activity: Yes    Partners: Female  Lifestyle  . Physical activity:    Days per week: 0 days    Minutes per session: 0 min  . Stress: Not on file  Relationships  . Social  connections:    Talks on phone: Not on file    Gets together: Not on file    Attends religious service: Not on file    Active member of club or organization: Not on file    Attends meetings of clubs or organizations: Not on file    Relationship status: Not on file  Other Topics Concern  . Not on file  Social History Narrative   Pt has 3 adopted children.    Past Medical History:  Diagnosis Date  . Sleep apnea      Patient Active Problem List   Diagnosis Date Noted  . Obesity 11/09/2017  . Tobacco use disorder 11/09/2017  . Psoriasis 11/09/2017  . Sleep apnea     Past Surgical  History:  Procedure Laterality Date  . COLONOSCOPY WITH PROPOFOL N/A 01/06/2018   Procedure: COLONOSCOPY WITH PROPOFOL;  Surgeon: Jonathon Bellows, MD;  Location: [facility] ENDOSCOPY;  Service: Gastroenterology;  Laterality: N/A;  . KNEE SURGERY Bilateral 1975   removed osteochondromas at age 11  . NOSE SURGERY     to help with OSA, opened up passages  . TONSILLECTOMY    . WISDOM TOOTH EXTRACTION      Family History        Family Status  Relation Name Status  . Mother  Alive  . Father  Alive  . Sister half sister Alive  . Brother half brother Alive  . Brother half brother Alive  . Neg Hx  (Not Specified)        His family history includes Colon cancer in his father; Hypertension in his brother and mother. There is no history of Prostate cancer.      No Known Allergies   Current Outpatient Medications:  .  buPROPion (WELLBUTRIN XL) 300 MG 24 hr tablet, Take 1 tablet (300 mg total) by mouth daily., Disp: 30 tablet, Rfl: 2 .  triamcinolone ointment (KENALOG) 0.5 %, Apply 1 application topically 2 (two) times daily. (Patient not taking: Reported on 05/03/2018), Disp: 60 g, Rfl: 2   Patient Care Team: Virginia Crews, MD as PCP - General (Family Medicine)      Objective:   Vitals: BP 122/74 (BP Location: Left Arm, Patient Position: Sitting, Cuff Size: Large)   Pulse 87   Temp 97.9 F (36.6 C) (Oral)   Resp 20   Ht 6\' 1"  (1.854 m)   Wt (!) 341 lb (154.7 kg)   SpO2 96%   BMI 44.99 kg/m    Vitals:   05/03/18 1003  BP: 122/74  Pulse: 87  Resp: 20  Temp: 97.9 F (36.6 C)  TempSrc: Oral  SpO2: 96%  Weight: (!) 341 lb (154.7 kg)  Height: 6\' 1"  (1.854 m)     Physical Exam  Constitutional: He is oriented to person, place, and time. He appears well-developed and well-nourished. No distress.  HENT:  Head: Normocephalic and atraumatic.  Right Ear: External ear normal.  Left Ear: External ear normal.  Nose: Nose normal.  Mouth/Throat: Oropharynx is clear and moist.  Eyes:  Pupils are equal, round, and reactive to light. Conjunctivae and EOM are normal. No scleral icterus.  Neck: Neck supple. No thyromegaly present.  Cardiovascular: Normal rate, regular rhythm, normal heart sounds and intact distal pulses.  No murmur heard. Pulmonary/Chest: Effort normal and breath sounds normal. No respiratory distress. He has no wheezes. He has no rales.  Abdominal: Soft. Bowel sounds are normal. He exhibits no distension. There is no tenderness. There is no  rebound and no guarding.  Musculoskeletal: He exhibits no edema or deformity.  Lymphadenopathy:    He has no cervical adenopathy.  Neurological: He is alert and oriented to person, place, and time. He has normal strength. No cranial nerve deficit or sensory deficit. He exhibits normal muscle tone. Gait normal.  Skin: Skin is warm and dry. Capillary refill takes less than 2 seconds. No rash noted.  Psychiatric: He has a normal mood and affect. His behavior is normal.  Vitals reviewed.    Depression Screen PHQ 2/9 Scores 05/03/2018 11/09/2017  PHQ - 2 Score 1 0     Assessment & Plan:     Routine Health Maintenance and Physical Exam  Exercise Activities and Dietary recommendations Goals    None      Immunization History  Administered Date(s) Administered  . Influenza Split 08/04/2015  . Influenza,inj,Quad PF,6+ Mos 11/09/2017  . Tdap 11/09/2017    Health Maintenance  Topic Date Due  . HIV Screening  05/22/1981  . INFLUENZA VACCINE  05/12/2018  . COLONOSCOPY  01/06/2021  . TETANUS/TDAP  11/10/2027     Discussed health benefits of physical activity, and encouraged him to engage in regular exercise appropriate for his age and condition.    -------------------------------------------------------------------- Problem List Items Addressed This Visit      Other   Obesity   Relevant Orders   Lipid panel   Comprehensive metabolic panel   Tobacco use disorder    Other Visit Diagnoses    Encounter for  annual physical exam    -  Primary   Relevant Orders   Lipid panel   Comprehensive metabolic panel   CBC       Return in about 1 year (around 05/04/2019) for CPE.   The entirety of the information documented in the History of Present Illness, Review of Systems and Physical Exam were personally obtained by me. Portions of this information were initially documented by Raquel Sarna Ratchford, CMA and reviewed by me for thoroughness and accuracy.    Virginia Crews, MD, MPH [facility] 05/03/2018 10:34 AM

## 2018-05-03 NOTE — Patient Instructions (Signed)

## 2018-05-04 LAB — COMPREHENSIVE METABOLIC PANEL
A/G RATIO: 1.5 (ref 1.2–2.2)
ALT: 22 IU/L (ref 0–44)
AST: 9 IU/L (ref 0–40)
Albumin: 4 g/dL (ref 3.5–5.5)
Alkaline Phosphatase: 102 IU/L (ref 39–117)
BUN/Creatinine Ratio: 18 (ref 9–20)
BUN: 15 mg/dL (ref 6–24)
Bilirubin Total: 0.3 mg/dL (ref 0.0–1.2)
CALCIUM: 9.5 mg/dL (ref 8.7–10.2)
CO2: 20 mmol/L (ref 20–29)
Chloride: 106 mmol/L (ref 96–106)
Creatinine, Ser: 0.83 mg/dL (ref 0.76–1.27)
GFR, EST AFRICAN AMERICAN: 118 mL/min/{1.73_m2} (ref >59)
GFR, EST NON AFRICAN AMERICAN: 102 mL/min/{1.73_m2} (ref >59)
Globulin, Total: 2.7 g/dL (ref 1.5–4.5)
Glucose: 130 mg/dL — ABNORMAL HIGH (ref 65–99)
Potassium: 4.5 mmol/L (ref 3.5–5.2)
Sodium: 140 mmol/L (ref 134–144)
Total Protein: 6.7 g/dL (ref 6.0–8.5)

## 2018-05-04 LAB — CBC
HEMATOCRIT: 44.5 % (ref 37.5–51.0)
HEMOGLOBIN: 15.1 g/dL (ref 13.0–17.7)
MCH: 30.3 pg (ref 26.6–33.0)
MCHC: 33.9 g/dL (ref 31.5–35.7)
MCV: 89 fL (ref 79–97)
Platelets: 259 10*3/uL (ref 150–450)
RBC: 4.99 x10E6/uL (ref 4.14–5.80)
RDW: 14.9 % (ref 12.3–15.4)
WBC: 8.9 10*3/uL (ref 3.4–10.8)

## 2018-05-04 LAB — LIPID PANEL
CHOL/HDL RATIO: 4.8 ratio (ref 0.0–5.0)
Cholesterol, Total: 167 mg/dL (ref 100–199)
HDL: 35 mg/dL — ABNORMAL LOW (ref >39)
LDL CALC: 115 mg/dL — AB (ref 0–99)
TRIGLYCERIDES: 86 mg/dL (ref 0–149)
VLDL Cholesterol Cal: 17 mg/dL (ref 5–40)

## 2018-05-05 ENCOUNTER — Telehealth: Payer: Self-pay

## 2018-05-05 NOTE — Telephone Encounter (Signed)
Pt advised of labs. [place] to add on hgb A1C.

## 2018-05-05 NOTE — Telephone Encounter (Signed)
-----   Message from Virginia Crews, MD sent at 05/04/2018 11:47 AM EDT ----- Cholesterol is slightly elevated, but at goal considering patient's other medical problems.  10-year risk for heart disease/stroke is high at 8.4%.  This is mostly driven by his smoking.  If he quit smoking, this risk decreases to about 4%.  Would not recommend a medication at this time.  Do recommend regular exercise-30 minutes a day 5 times a week at least-and diet low in saturated fat.  Normal blood counts, kidney function, liver function, electrolytes.  Blood sugar is elevated if the patient was fasting.  Please add on an A1c to see if there is any prediabetes or diabetes.  Virginia Crews, MD, MPH [facility] 05/04/2018 11:47 AM

## 2018-05-06 ENCOUNTER — Telehealth: Payer: Self-pay

## 2018-05-06 NOTE — Telephone Encounter (Signed)
-----   Message from Virginia Crews, MD sent at 05/06/2018  8:42 AM EDT ----- A1c is in diabetic range.  Would like to see patient for OV to discuss treatment options and what being diagnosed with diabetes means.  Virginia Crews, MD, MPH [facility] 05/06/2018 8:42 AM

## 2018-05-06 NOTE — Telephone Encounter (Signed)
Pt advised. Is currently driving to [place], and will call back to schedule appointment.

## 2018-05-07 LAB — SPECIMEN STATUS REPORT

## 2018-05-07 LAB — HGB A1C W/O EAG: Hgb A1c MFr Bld: 7.1 % — ABNORMAL HIGH (ref 4.8–5.6)

## 2018-05-17 ENCOUNTER — Ambulatory Visit (INDEPENDENT_AMBULATORY_CARE_PROVIDER_SITE_OTHER): Payer: 59 | Admitting: Family Medicine

## 2018-05-17 ENCOUNTER — Encounter: Payer: Self-pay | Admitting: Family Medicine

## 2018-05-17 VITALS — BP 128/80 | HR 84 | Temp 98.0°F | Resp 20 | Wt 345.0 lb

## 2018-05-17 DIAGNOSIS — E785 Hyperlipidemia, unspecified: Secondary | ICD-10-CM | POA: Diagnosis not present

## 2018-05-17 DIAGNOSIS — F17200 Nicotine dependence, unspecified, uncomplicated: Secondary | ICD-10-CM

## 2018-05-17 DIAGNOSIS — E119 Type 2 diabetes mellitus without complications: Secondary | ICD-10-CM | POA: Diagnosis not present

## 2018-05-17 DIAGNOSIS — Z23 Encounter for immunization: Secondary | ICD-10-CM | POA: Diagnosis not present

## 2018-05-17 DIAGNOSIS — E1169 Type 2 diabetes mellitus with other specified complication: Secondary | ICD-10-CM

## 2018-05-17 LAB — POCT UA - MICROALBUMIN: MICROALBUMIN (UR) POC: 20 mg/L

## 2018-05-17 MED ORDER — BUPROPION HCL ER (XL) 300 MG PO TB24: 300.0000 mg | ORAL_TABLET | Freq: Every day | ORAL | 2 refills | Status: DC

## 2018-05-17 NOTE — Patient Instructions (Addendum)
     Call [facility] to schedule a diabetic eye exam   Diet Recommendations for Diabetes   Starchy (carb) foods include: Bread, rice, pasta, potatoes, corn, crackers, bagels, muffins, all baked goods.  (Fruits, milk, and yogurt also have carbohydrate, but most of these foods will not spike your blood sugar as the starchy foods will.)  A few fruits do cause high blood sugars; use small portions of bananas (limit to 1/2 at a time), grapes, watermelon, and most tropical fruits.    Protein foods include: Meat, fish, poultry, eggs, dairy foods, and beans such as pinto and kidney beans (beans also provide carbohydrate).   1. Eat at least 3 meals and 1-2 snacks per day. Never go more than 4-5 hours while awake without eating. Eat breakfast within the first hour of getting up.   2. Limit starchy foods to TWO per meal and ONE per snack. ONE portion of a starchy  food is equal to the following:   - ONE slice of bread (or its equivalent, such as half of a hamburger bun).   - 1/2 cup of a "scoopable" starchy food such as potatoes or rice.   - 15 grams of carbohydrate as shown on food label.  3. Include at every meal: a protein food, a carb food, and vegetables and/or fruit.   - Obtain twice as many veg's as protein or carbohydrate foods for both lunch and dinner.   - Fresh or frozen veg's are best.   - Try to keep frozen veg's on hand for a quick vegetable serving.

## 2018-05-17 NOTE — Progress Notes (Signed)
p      Patient: Dillon Bolton Male    DOB: May 30, 1966   52 y.o.   MRN: [id number] Visit Date: 05/18/2018  Today's Provider: Lavon Paganini, MD   I, Martha Clan, CMA, am acting as scribe for Lavon Paganini, MD.  Chief Complaint  Patient presents with  . Diabetes   Subjective:    HPI   Pt was recently diagnosed with diabetes. Pt states he has a diverse diet, ranging from fast food, red meat, sugary foods to fruits and vegetable. He does not get regular exercise, other than mowing lawn, etc. He finds it difficult to exercise due to working 12 hours days. He denies s/s of DM, including polyuria, polydipsia, visual changes, feet paresthesias.   He is not interested in taking a medication at this time.  Lab Results  Component Value Date   HGBA1C 7.1 (H) 05/03/2018   Wt Readings from Last 3 Encounters:  05/17/18 (!) 345 lb (156.5 kg)  05/03/18 (!) 341 lb (154.7 kg)  03/03/18 (!) 341 lb (154.7 kg)     No Known Allergies   Current Outpatient Medications:  .  buPROPion (WELLBUTRIN XL) 300 MG 24 hr tablet, Take 1 tablet (300 mg total) by mouth daily., Disp: 30 tablet, Rfl: 2 .  triamcinolone ointment (KENALOG) 0.5 %, Apply 1 application topically 2 (two) times daily. (Patient not taking: Reported on 05/03/2018), Disp: 60 g, Rfl: 2  Review of Systems  Constitutional: Negative for activity change, appetite change, chills, diaphoresis, fatigue, fever and unexpected weight change.  Eyes: Negative for visual disturbance.  Respiratory: Negative for shortness of breath.   Cardiovascular: Negative for chest pain, palpitations and leg swelling.  Endocrine: Negative for polydipsia, polyphagia and polyuria.  Neurological: Negative for numbness.    Social History   Tobacco Use  . Smoking status: Current Every Day Smoker    Packs/day: 0.50    Types: Cigarettes    Start date: 53  . Smokeless tobacco: Never Used  . Tobacco comment: started smoking at age 66. Is smoking  0.25-0.75 PPD, which is decreased from 1.5-2 PPD  Substance Use Topics  . Alcohol use: Yes    Alcohol/week: 0.6 - 1.2 oz    Types: 1 - 2 Cans of beer per week    Comment: occasional   Objective:   BP 128/80 (BP Location: Left Arm, Patient Position: Sitting, Cuff Size: Large)   Pulse 84   Temp 98 F (36.7 C) (Oral)   Resp 20   Wt (!) 345 lb (156.5 kg)   SpO2 96%   BMI 45.52 kg/m  Vitals:   05/17/18 1132  BP: 128/80  Pulse: 84  Resp: 20  Temp: 98 F (36.7 C)  TempSrc: Oral  SpO2: 96%  Weight: (!) 345 lb (156.5 kg)     Physical Exam  Constitutional: He is oriented to person, place, and time. He appears well-developed and well-nourished. No distress.  HENT:  Head: Normocephalic and atraumatic.  Mouth/Throat: Oropharynx is clear and moist.  Eyes: Conjunctivae are normal. No scleral icterus.  Neck: Neck supple. No thyromegaly present.  Cardiovascular: Normal rate, regular rhythm, normal heart sounds and intact distal pulses.  No murmur heard. Pulmonary/Chest: Effort normal and breath sounds normal. No respiratory distress. He has no wheezes. He has no rales.  Abdominal: Soft. He exhibits no distension. There is no tenderness.  Musculoskeletal: He exhibits no edema or deformity.  Lymphadenopathy:    He has no cervical adenopathy.  Neurological: He is alert  and oriented to person, place, and time. No cranial nerve deficit.  Skin: Skin is warm and dry. Capillary refill takes less than 2 seconds. No rash noted.  Psychiatric: He has a normal mood and affect. His behavior is normal.  Vitals reviewed.   Diabetic Foot Exam - Simple   Simple Foot Form Diabetic Foot exam was performed with the following findings:  Yes 05/18/2018  9:53 AM  Visual Inspection No deformities, no ulcerations, no other skin breakdown bilaterally:  Yes Sensation Testing Intact to touch and monofilament testing bilaterally:  Yes Pulse Check Posterior Tibialis and Dorsalis pulse intact bilaterally:   Yes Comments     Lab Results  Component Value Date   HGBA1C 7.1 (H) 05/03/2018    Results for orders placed or performed in visit on 05/17/18  POCT UA - Microalbumin  Result Value Ref Range   Microalbumin Ur, POC 20 mg/L        Assessment & Plan:   Problem List Items Addressed This Visit      Endocrine   Hyperlipidemia associated with type 2 diabetes mellitus ([place])    Discussed ASCVD risk of 8.4% in next 10 yrs Discussed diet and exercise (he is "not willing to give up steak") Discussed and agreed on 3 months of lifestyle interventions prior to considering statin Recheck lipids at next visit      Type 2 diabetes mellitus without complication, without long-term current use of insulin ([facility]) - Primary    New diagnosis A1c 7.1 Discussed goal A1c <7 Discussed low carb diet and exercises Discussed long-term health risks of uncontrolled DM Discussed importance of vaccinations and screening Patient seems skeptical and does not believe that he is "very much of a diabetic" Patient declines glucometer and states he will not check his blood sugar We agreed to 3 months of lifestyle interventions prior to considering medication PCV given today Foot exam completed today microalbumin WNL Advised to schedule eye exam F/u in 3 months      Relevant Orders   Pneumococcal polysaccharide vaccine 23-valent greater than or equal to 2yo subcutaneous/IM (Completed)   POCT UA - Microalbumin (Completed)     Other   Tobacco use disorder    Discussed importance of cessation Continue wellbutrin at current dose          Return in about 3 months (around 08/17/2018) for chronic disease f/u.   The entirety of the information documented in the History of Present Illness, Review of Systems and Physical Exam were personally obtained by me. Portions of this information were initially documented by Raquel Sarna Ratchford, CMA and reviewed by me for thoroughness and accuracy.    Virginia Crews, MD, MPH [facility] 05/18/2018 9:57 AM

## 2018-05-18 ENCOUNTER — Telehealth: Payer: Self-pay

## 2018-05-18 DIAGNOSIS — E1165 Type 2 diabetes mellitus with hyperglycemia: Secondary | ICD-10-CM

## 2018-05-18 DIAGNOSIS — E119 Type 2 diabetes mellitus without complications: Secondary | ICD-10-CM | POA: Insufficient documentation

## 2018-05-18 NOTE — Assessment & Plan Note (Signed)
Discussed importance of cessation Continue wellbutrin at current dose

## 2018-05-18 NOTE — Telephone Encounter (Signed)
-----   Message from Virginia Crews, MD sent at 05/18/2018 10:34 AM EDT ----- Normal urine microalbumin  Brita Romp Dionne Bucy, MD, MPH [facility] 05/18/2018 10:34 AM

## 2018-05-18 NOTE — Assessment & Plan Note (Signed)
Discussed ASCVD risk of 8.4% in next 10 yrs Discussed diet and exercise (he is "not willing to give up steak") Discussed and agreed on 3 months of lifestyle interventions prior to considering statin Recheck lipids at next visit

## 2018-05-18 NOTE — Telephone Encounter (Signed)
Left message advising pt. OK per DPR. 

## 2018-05-18 NOTE — Assessment & Plan Note (Addendum)
New diagnosis A1c 7.1 Discussed goal A1c <7 Discussed low carb diet and exercises Discussed long-term health risks of uncontrolled DM Discussed importance of vaccinations and screening Patient seems skeptical and does not believe that he is "very much of a diabetic" Patient declines glucometer and states he will not check his blood sugar We agreed to 3 months of lifestyle interventions prior to considering medication PCV given today Foot exam completed today microalbumin WNL Advised to schedule eye exam F/u in 3 months

## 2018-05-25 ENCOUNTER — Telehealth: Payer: Self-pay | Admitting: Family Medicine

## 2018-05-25 NOTE — Telephone Encounter (Signed)
Dillon Bolton with [facility] called stating they had gotten a referral on pt and its for in home sleep study and [facility] in the [place] office only does in lab sleep studies. Thanks CC

## 2018-05-26 NOTE — Telephone Encounter (Signed)
Per Sharyn Lull at [facility] the order is for in lab study but office note is indicating home sleep study.Order for home sleep study faxed to [facility]

## 2018-05-26 NOTE — Telephone Encounter (Signed)
Can this be sent to the [facility] office for in home sleep study?  Virginia Crews, MD, MPH [facility] 05/26/2018 2:05 PM

## 2018-05-27 NOTE — Telephone Encounter (Signed)
Thank you.  Virginia Crews, MD, MPH [facility] 05/27/2018 3:17 PM

## 2018-07-21 ENCOUNTER — Telehealth: Payer: Self-pay | Admitting: Family Medicine

## 2018-07-21 NOTE — Telephone Encounter (Signed)
Pt stated that he hasn't heard anything about his in home sleep study since it was ordered in August. Pt is requesting call back to discuss the status. Please advise. Thanks TNP

## 2018-08-04 DIAGNOSIS — Z23 Encounter for immunization: Secondary | ICD-10-CM | POA: Diagnosis not present

## 2018-08-09 DIAGNOSIS — G4733 Obstructive sleep apnea (adult) (pediatric): Secondary | ICD-10-CM | POA: Diagnosis not present

## 2018-08-09 DIAGNOSIS — R0602 Shortness of breath: Secondary | ICD-10-CM | POA: Diagnosis not present

## 2018-08-10 DIAGNOSIS — G4733 Obstructive sleep apnea (adult) (pediatric): Secondary | ICD-10-CM | POA: Diagnosis not present

## 2018-08-10 DIAGNOSIS — R0602 Shortness of breath: Secondary | ICD-10-CM | POA: Diagnosis not present

## 2018-08-18 ENCOUNTER — Ambulatory Visit: Payer: 59 | Admitting: Family Medicine

## 2018-09-01 ENCOUNTER — Ambulatory Visit (INDEPENDENT_AMBULATORY_CARE_PROVIDER_SITE_OTHER): Payer: 59 | Admitting: Family Medicine

## 2018-09-01 ENCOUNTER — Encounter: Payer: Self-pay | Admitting: Family Medicine

## 2018-09-01 VITALS — BP 178/90 | HR 87 | Temp 98.0°F | Wt 357.0 lb

## 2018-09-01 DIAGNOSIS — R1012 Left upper quadrant pain: Secondary | ICD-10-CM | POA: Insufficient documentation

## 2018-09-01 DIAGNOSIS — G4733 Obstructive sleep apnea (adult) (pediatric): Secondary | ICD-10-CM

## 2018-09-01 DIAGNOSIS — F17200 Nicotine dependence, unspecified, uncomplicated: Secondary | ICD-10-CM

## 2018-09-01 DIAGNOSIS — E785 Hyperlipidemia, unspecified: Secondary | ICD-10-CM

## 2018-09-01 DIAGNOSIS — E1169 Type 2 diabetes mellitus with other specified complication: Secondary | ICD-10-CM | POA: Diagnosis not present

## 2018-09-01 DIAGNOSIS — R030 Elevated blood-pressure reading, without diagnosis of hypertension: Secondary | ICD-10-CM

## 2018-09-01 DIAGNOSIS — E119 Type 2 diabetes mellitus without complications: Secondary | ICD-10-CM | POA: Diagnosis not present

## 2018-09-01 DIAGNOSIS — W19XXXA Unspecified fall, initial encounter: Secondary | ICD-10-CM

## 2018-09-01 LAB — POCT GLYCOSYLATED HEMOGLOBIN (HGB A1C): HEMOGLOBIN A1C: 7 % — AB (ref 4.0–5.6)

## 2018-09-01 NOTE — Assessment & Plan Note (Signed)
Not to goal, but improving A1c at 7 today Discussed low-carb diet and exercise Again discussed long-term health risks of uncontrolled diabetes Patient has upcoming eye exam Up-to-date on screenings and vaccinations Still does not want to start a medication, so will try more lifestyle interventions and follow-up in 3 months with recheck A1c

## 2018-09-01 NOTE — Assessment & Plan Note (Signed)
Some abdominal discomfort/chest wall discomfort after fall a few days ago He has no evidence of intra-abdominal pathology or rib fractures Reassurance given natural course discussed Discussed return precautions

## 2018-09-01 NOTE — Assessment & Plan Note (Signed)
No previous diagnosis of hypertension and blood pressure has been controlled well at previous visits Is asymptomatic Advised low-sodium diet Follow-up in 2 weeks for blood pressure recheck If still uncontrolled, will need to consider medication

## 2018-09-01 NOTE — Assessment & Plan Note (Signed)
Discussed importance of cessation He has cut back on his smoking Continue Wellbutrin at current dose

## 2018-09-01 NOTE — Assessment & Plan Note (Signed)
Weight is up today Encourage diet and exercise Planning to start plant-based diet

## 2018-09-01 NOTE — Patient Instructions (Signed)
Diabetes Mellitus and Nutrition When you have diabetes (diabetes mellitus), it is very important to have healthy eating habits because your blood sugar (glucose) levels are greatly affected by what you eat and drink. Eating healthy foods in the appropriate amounts, at about the same times every day, can help you:  Control your blood glucose.  Lower your risk of heart disease.  Improve your blood pressure.  Reach or maintain a healthy weight.  Every person with diabetes is different, and each person has different needs for a meal plan. Your health care provider may recommend that you work with a diet and nutrition specialist (dietitian) to make a meal plan that is best for you. Your meal plan may vary depending on factors such as:  The calories you need.  The medicines you take.  Your weight.  Your blood glucose, blood pressure, and cholesterol levels.  Your activity level.  Other health conditions you have, such as heart or kidney disease.  How do carbohydrates affect me? Carbohydrates affect your blood glucose level more than any other type of food. Eating carbohydrates naturally increases the amount of glucose in your blood. Carbohydrate counting is a method for keeping track of how many carbohydrates you eat. Counting carbohydrates is important to keep your blood glucose at a healthy level, especially if you use insulin or take certain oral diabetes medicines. It is important to know how many carbohydrates you can safely have in each meal. This is different for every person. Your dietitian can help you calculate how many carbohydrates you should have at each meal and for snack. Foods that contain carbohydrates include:  Bread, cereal, rice, pasta, and crackers.  Potatoes and corn.  Peas, beans, and lentils.  Milk and yogurt.  Fruit and juice.  Desserts, such as cakes, cookies, ice cream, and candy.  How does alcohol affect me? Alcohol can cause a sudden decrease in blood  glucose (hypoglycemia), especially if you use insulin or take certain oral diabetes medicines. Hypoglycemia can be a life-threatening condition. Symptoms of hypoglycemia (sleepiness, dizziness, and confusion) are similar to symptoms of having too much alcohol. If your health care provider says that alcohol is safe for you, follow these guidelines:  Limit alcohol intake to no more than 1 drink per day for nonpregnant women and 2 drinks per day for men. One drink equals 12 oz of beer, 5 oz of wine, or 1 oz of hard liquor.  Do not drink on an empty stomach.  Keep yourself hydrated with water, diet soda, or unsweetened iced tea.  Keep in mind that regular soda, juice, and other mixers may contain a lot of sugar and must be counted as carbohydrates.  What are tips for following this plan? Reading food labels  Start by checking the serving size on the label. The amount of calories, carbohydrates, fats, and other nutrients listed on the label are based on one serving of the food. Many foods contain more than one serving per package.  Check the total grams (g) of carbohydrates in one serving. You can calculate the number of servings of carbohydrates in one serving by dividing the total carbohydrates by 15. For example, if a food has 30 g of total carbohydrates, it would be equal to 2 servings of carbohydrates.  Check the number of grams (g) of saturated and trans fats in one serving. Choose foods that have low or no amount of these fats.  Check the number of milligrams (mg) of sodium in one serving. Most people   should limit total sodium intake to less than 2,300 mg per day.  Always check the nutrition information of foods labeled as "low-fat" or "nonfat". These foods may be higher in added sugar or refined carbohydrates and should be avoided.  Talk to your dietitian to identify your daily goals for nutrients listed on the label. Shopping  Avoid buying canned, premade, or processed foods. These  foods tend to be high in fat, sodium, and added sugar.  Shop around the outside edge of the grocery store. This includes fresh fruits and vegetables, bulk grains, fresh meats, and fresh dairy. Cooking  Use low-heat cooking methods, such as baking, instead of high-heat cooking methods like deep frying.  Cook using healthy oils, such as olive, canola, or sunflower oil.  Avoid cooking with butter, cream, or high-fat meats. Meal planning  Eat meals and snacks regularly, preferably at the same times every day. Avoid going long periods of time without eating.  Eat foods high in fiber, such as fresh fruits, vegetables, beans, and whole grains. Talk to your dietitian about how many servings of carbohydrates you can eat at each meal.  Eat 4-6 ounces of lean protein each day, such as lean meat, chicken, fish, eggs, or tofu. 1 ounce is equal to 1 ounce of meat, chicken, or fish, 1 egg, or 1/4 cup of tofu.  Eat some foods each day that contain healthy fats, such as avocado, nuts, seeds, and fish. Lifestyle   Check your blood glucose regularly.  Exercise at least 30 minutes 5 or more days each week, or as told by your health care provider.  Take medicines as told by your health care provider.  Do not use any products that contain nicotine or tobacco, such as cigarettes and e-cigarettes. If you need help quitting, ask your health care provider.  Work with a counselor or diabetes educator to identify strategies to manage stress and any emotional and social challenges. What are some questions to ask my health care provider?  Do I need to meet with a diabetes educator?  Do I need to meet with a dietitian?  What number can I call if I have questions?  When are the best times to check my blood glucose? Where to find more information:  American Diabetes Association: diabetes.org/food-and-fitness/food  Academy of Nutrition and Dietetics:  www.eatright.org/resources/health/diseases-and-conditions/diabetes  National Institute of Diabetes and Digestive and Kidney Diseases (NIH): www.niddk.nih.gov/health-information/diabetes/overview/diet-eating-physical-activity Summary  A healthy meal plan will help you control your blood glucose and maintain a healthy lifestyle.  Working with a diet and nutrition specialist (dietitian) can help you make a meal plan that is best for you.  Keep in mind that carbohydrates and alcohol have immediate effects on your blood glucose levels. It is important to count carbohydrates and to use alcohol carefully. This information is not intended to replace advice given to you by your health care provider. Make sure you discuss any questions you have with your health care provider. Document Released: 06/25/2005 Document Revised: 11/02/2016 Document Reviewed: 11/02/2016 Elsevier Interactive Patient Education  2018 Elsevier Inc.  

## 2018-09-01 NOTE — Assessment & Plan Note (Signed)
Discussed and importance of cholesterol control in the setting of diabetes as well as ASCVD risk of 8.4% in the next 10 years Discussed diet and exercise again and patient admits that his diet has not well controlled His wife is planning for them to be on a plant-based diet After 3 months of this, at follow-up, we will recheck lipids and consider statin He declines rechecking lipids today

## 2018-09-01 NOTE — Progress Notes (Signed)
Patient: Dillon Bolton Male    DOB: 1966-02-18   52 y.o.   MRN: [id number] Visit Date: 09/01/2018  Today's Provider: Lavon Paganini, MD   Chief Complaint  Patient presents with  . Diabetes  . Hyperlipidemia   Subjective:    I, Tiburcio Pea, CMA, am acting as a Education administrator for Lavon Paganini, MD.   HPI  Diabetes Mellitus Type II, Follow-up:   Lab Results  Component Value Date   HGBA1C 7.0 (A) 09/01/2018   HGBA1C 7.1 (H) 05/03/2018   Last seen for diabetes 3 months ago.  Management since then includes no change. Patient preferred not to start medication He reports good compliance with treatment. He is not having side effects.  Current symptoms include none  Home blood sugar records: not being checked  Episodes of hypoglycemia? unknown   Current Insulin Regimen: none Most Recent Eye Exam: not UTD scheduled for 09/28/2018- [facility] Weight trend: stable Current diet: planning to restart a plant based diet soon - says that he is doing poorly with diet currently Current exercise: none  ------------------------------------------------------------------------   Lipid/Cholesterol, Follow-up:   Last seen for this 3 months ago.  Management since that visit includes no change.  Last Lipid Panel:    Component Value Date/Time   CHOL 167 05/03/2018 1039   TRIG 86 05/03/2018 1039   HDL 35 (L) 05/03/2018 1039   CHOLHDL 4.8 05/03/2018 1039   LDLCALC 115 (H) 05/03/2018 1039    He reports good compliance with treatment. He is not having side effects.   Wt Readings from Last 3 Encounters:  09/01/18 (!) 357 lb (161.9 kg)  05/17/18 (!) 345 lb (156.5 kg)  05/03/18 (!) 341 lb (154.7 kg)    Patent was visiting family this weekend and fell onto L side while carrying something heavy.  He reports landing on L elbow and arm pressed into upper abdomen.  Initially had SOB and pain with deep inspiration, but this is improving.  Denies any fevers or wheezing, chest pain,  nausea, vomiting, diarrhea, blood in stool.  Blood pressure: Patient previously has normal blood pressure.  It was noted to be high at the dentist yesterday in the 094 systolic.  It is high again today.  He denies any chest pain, shortness of breath, headache, vision changes.  He has been eating high sodium foods recently, including [place] food.  Patient continues to smoke, but he has cut down significantly.  He believes the Wellbutrin is cutting down his cravings significantly.  He was previously a pack per day smoker and now a pack will last about 4 days.  He hopes to quit completely.  Patient's new CPAP machine was delivered to his house this week.  He is only had for 2 days, but he is using it consistently.  He feels that his sleep is more restful  ------------------------------------------------------------------------     No Known Allergies   Current Outpatient Medications:  .  buPROPion (WELLBUTRIN XL) 300 MG 24 hr tablet, Take 1 tablet (300 mg total) by mouth daily., Disp: 30 tablet, Rfl: 2  Review of Systems  Constitutional: Negative.   Respiratory: Negative.   Cardiovascular: Negative.   Gastrointestinal: Negative.   Endocrine: Negative.   Musculoskeletal: Negative.     Social History   Tobacco Use  . Smoking status: Current Every Day Smoker    Packs/day: 0.50    Types: Cigarettes    Start date: 76  . Smokeless tobacco: Never Used  .  Tobacco comment: started smoking at age 77. Is smoking 0.25-0.75 PPD, which is decreased from 1.5-2 PPD  Substance Use Topics  . Alcohol use: Yes    Alcohol/week: 1.0 - 2.0 standard drinks    Types: 1 - 2 Cans of beer per week    Comment: occasional   Objective:   BP (!) 178/90 (BP Location: Right Arm, Patient Position: Sitting, Cuff Size: Large)   Pulse 87   Temp 98 F (36.7 C) (Oral)   Wt (!) 357 lb (161.9 kg)   SpO2 96%   BMI 47.10 kg/m  Vitals:   09/01/18 1051  BP: (!) 178/90  Pulse: 87  Temp: 98 F (36.7 C)    TempSrc: Oral  SpO2: 96%  Weight: (!) 357 lb (161.9 kg)     Physical Exam  Constitutional: He is oriented to person, place, and time. He appears well-developed and well-nourished. No distress.  Morbidly obese  HENT:  Head: Normocephalic and atraumatic.  Mouth/Throat: Oropharynx is clear and moist. No oropharyngeal exudate.  Eyes: Pupils are equal, round, and reactive to light. Conjunctivae are normal. Right eye exhibits no discharge. Left eye exhibits no discharge. No scleral icterus.  Neck: Neck supple. No thyromegaly present.  Cardiovascular: Normal rate, regular rhythm, normal heart sounds and intact distal pulses.  No murmur heard. Pulmonary/Chest: Effort normal and breath sounds normal. No respiratory distress. He has no wheezes. He has no rales.  Abdominal: Soft. He exhibits no distension. There is no tenderness. There is no rebound.  Musculoskeletal: He exhibits no edema.  Lymphadenopathy:    He has no cervical adenopathy.  Neurological: He is alert and oriented to person, place, and time.  Skin: Skin is warm and dry. Capillary refill takes less than 2 seconds. No rash noted.  Psychiatric: He has a normal mood and affect. His behavior is normal.  Vitals reviewed.   Results for orders placed or performed in visit on 09/01/18  POCT HgB A1C  Result Value Ref Range   Hemoglobin A1C 7.0 (A) 4.0 - 5.6 %   HbA1c POC (<> result, manual entry)     HbA1c, POC (prediabetic range)     HbA1c, POC (controlled diabetic range)         Assessment & Plan:   Problem List Items Addressed This Visit      Respiratory   Sleep apnea     Endocrine   Hyperlipidemia associated with type 2 diabetes mellitus ([place])   Type 2 diabetes mellitus without complication, without long-term current use of insulin ([place]) - Primary   Relevant Orders   POCT HgB A1C (Completed)     Other   Morbid obesity ([place])   Tobacco use disorder    Other Visit Diagnoses    Elevated BP without diagnosis of  hypertension       Fall, initial encounter       LUQ pain           Return in about 3 months (around 12/02/2018) for diabetes, cholesterol f/u.   The entirety of the information documented in the History of Present Illness, Review of Systems and Physical Exam were personally obtained by me. Portions of this information were initially documented by Tiburcio Pea, CMA and reviewed by me for thoroughness and accuracy.    Virginia Crews, MD, MPH [facility] 09/01/2018 11:58 AM

## 2018-09-01 NOTE — Assessment & Plan Note (Signed)
Well-controlled with CPAP Using this with good compliance

## 2018-09-16 ENCOUNTER — Ambulatory Visit: Payer: 59 | Admitting: Family Medicine

## 2018-09-19 ENCOUNTER — Encounter: Payer: Self-pay | Admitting: Family Medicine

## 2018-09-19 ENCOUNTER — Ambulatory Visit (INDEPENDENT_AMBULATORY_CARE_PROVIDER_SITE_OTHER): Payer: 59 | Admitting: Family Medicine

## 2018-09-19 VITALS — BP 135/80 | HR 101 | Temp 98.1°F | Wt 358.5 lb

## 2018-09-19 DIAGNOSIS — R030 Elevated blood-pressure reading, without diagnosis of hypertension: Secondary | ICD-10-CM | POA: Diagnosis not present

## 2018-09-19 NOTE — Progress Notes (Signed)
Patient: Dillon Bolton Male    DOB: May 21, 1966   52 y.o.   MRN: [id number] Visit Date: 09/19/2018  Today's Provider: Lavon Paganini, MD   Chief Complaint  Patient presents with  . Elevated Blood Pressure   Subjective:    I, Tiburcio Pea, CMA, am acting as a Education administrator for Lavon Paganini, MD.   HPI Elevated Blood Pressure:  Patient presents today for a follow up. Last OV on 09/01/2018 BP was elevated. Patient advised to follow up for a recheck in approximately 2 weeks. Patient denies checking BP at home.   He does admit to eating a very high sodium diet and not exercising regularly.  He states that he ate 2 sausage croissants from [facility] on his way here this morning.  BP Readings from Last 3 Encounters:  09/19/18 140/85  09/01/18 (!) 178/90  05/17/18 128/80       No Known Allergies   Current Outpatient Medications:  .  buPROPion (WELLBUTRIN XL) 300 MG 24 hr tablet, Take 1 tablet (300 mg total) by mouth daily., Disp: 30 tablet, Rfl: 2  Review of Systems  Constitutional: Negative.   Respiratory: Negative.   Cardiovascular: Negative.   Musculoskeletal: Negative.     Social History   Tobacco Use  . Smoking status: Current Every Day Smoker    Packs/day: 0.50    Types: Cigarettes    Start date: 40  . Smokeless tobacco: Never Used  . Tobacco comment: started smoking at age 56. Is smoking 0.25-0.75 PPD, which is decreased from 1.5-2 PPD  Substance Use Topics  . Alcohol use: Yes    Alcohol/week: 1.0 - 2.0 standard drinks    Types: 1 - 2 Cans of beer per week    Comment: occasional   Objective:   BP 140/85 (BP Location: Right Arm, Patient Position: Sitting, Cuff Size: Large)   Pulse (!) 101   Temp 98.1 F (36.7 C) (Oral)   Wt (!) 358 lb 8 oz (162.6 kg)   BMI 47.30 kg/m  Vitals:   09/19/18 0955  BP: 140/85  Pulse: (!) 101  Temp: 98.1 F (36.7 C)  TempSrc: Oral  Weight: (!) 358 lb 8 oz (162.6 kg)     Physical Exam  Constitutional: He is  oriented to person, place, and time. He appears well-developed and well-nourished. No distress.  HENT:  Head: Normocephalic and atraumatic.  Eyes: Conjunctivae are normal. No scleral icterus.  Neck: Neck supple. No thyromegaly present.  Cardiovascular: Normal rate, regular rhythm, normal heart sounds and intact distal pulses.  No murmur heard. Pulmonary/Chest: Effort normal and breath sounds normal. No respiratory distress. He has no wheezes. He has no rales.  Musculoskeletal: He exhibits no edema.  Lymphadenopathy:    He has no cervical adenopathy.  Neurological: He is alert and oriented to person, place, and time.  Skin: Skin is warm and dry. Capillary refill takes less than 2 seconds. No rash noted.  Psychiatric: He has a normal mood and affect.  Vitals reviewed.      Assessment & Plan:   Problem List Items Addressed This Visit      Other   Elevated BP without diagnosis of hypertension - Primary    No previous diagnosis of hypertension and blood pressure prior to last visit has always been typically well controlled He is asymptomatic His blood pressure was initially elevated again today, but it is below goal on manual recheck He was advised again about low-sodium diet At his  visit in 2 months, we will follow-up on blood pressure again Advised on checking home blood pressures          Return if symptoms worsen or fail to improve.   The entirety of the information documented in the History of Present Illness, Review of Systems and Physical Exam were personally obtained by me. Portions of this information were initially documented by Tiburcio Pea, CMA and reviewed by me for thoroughness and accuracy.    Virginia Crews, MD, MPH [facility] 09/19/2018 10:35 AM

## 2018-09-19 NOTE — Patient Instructions (Signed)
DASH Eating Plan DASH stands for "Dietary Approaches to Stop Hypertension." The DASH eating plan is a healthy eating plan that has been shown to reduce high blood pressure (hypertension). It may also reduce your risk for type 2 diabetes, heart disease, and stroke. The DASH eating plan may also help with weight loss. What are tips for following this plan? General guidelines  Avoid eating more than 2,300 mg (milligrams) of salt (sodium) a day. If you have hypertension, you may need to reduce your sodium intake to 1,500 mg a day.  Limit alcohol intake to no more than 1 drink a day for nonpregnant women and 2 drinks a day for men. One drink equals 12 oz of beer, 5 oz of wine, or 1 oz of hard liquor.  Work with your health care provider to maintain a healthy body weight or to lose weight. Ask what an ideal weight is for you.  Get at least 30 minutes of exercise that causes your heart to beat faster (aerobic exercise) most days of the week. Activities may include walking, swimming, or biking.  Work with your health care provider or diet and nutrition specialist (dietitian) to adjust your eating plan to your individual calorie needs. Reading food labels  Check food labels for the amount of sodium per serving. Choose foods with less than 5 percent of the Daily Value of sodium. Generally, foods with less than 300 mg of sodium per serving fit into this eating plan.  To find whole grains, look for the word "whole" as the first word in the ingredient list. Shopping  Buy products labeled as "low-sodium" or "no salt added."  Buy fresh foods. Avoid canned foods and premade or frozen meals. Cooking  Avoid adding salt when cooking. Use salt-free seasonings or herbs instead of table salt or sea salt. Check with your health care provider or pharmacist before using salt substitutes.  Do not fry foods. Cook foods using healthy methods such as baking, boiling, grilling, and broiling instead.  Cook with  heart-healthy oils, such as olive, canola, soybean, or sunflower oil. Meal planning   Eat a balanced diet that includes: ? 5 or more servings of fruits and vegetables each day. At each meal, try to fill half of your plate with fruits and vegetables. ? Up to 6-8 servings of whole grains each day. ? Less than 6 oz of lean meat, poultry, or fish each day. A 3-oz serving of meat is about the same size as a deck of cards. One egg equals 1 oz. ? 2 servings of low-fat dairy each day. ? A serving of nuts, seeds, or beans 5 times each week. ? Heart-healthy fats. Healthy fats called Omega-3 fatty acids are found in foods such as flaxseeds and coldwater fish, like sardines, salmon, and mackerel.  Limit how much you eat of the following: ? Canned or prepackaged foods. ? Food that is high in trans fat, such as fried foods. ? Food that is high in saturated fat, such as fatty meat. ? Sweets, desserts, sugary drinks, and other foods with added sugar. ? Full-fat dairy products.  Do not salt foods before eating.  Try to eat at least 2 vegetarian meals each week.  Eat more home-cooked food and less restaurant, buffet, and fast food.  When eating at a restaurant, ask that your food be prepared with less salt or no salt, if possible. What foods are recommended? The items listed may not be a complete list. Talk with your dietitian about what   dietary choices are best for you. Grains Whole-grain or whole-wheat bread. Whole-grain or whole-wheat pasta. Brown rice. Oatmeal. Quinoa. Bulgur. Whole-grain and low-sodium cereals. Pita bread. Low-fat, low-sodium crackers. Whole-wheat flour tortillas. Vegetables Fresh or frozen vegetables (raw, steamed, roasted, or grilled). Low-sodium or reduced-sodium tomato and vegetable juice. Low-sodium or reduced-sodium tomato sauce and tomato paste. Low-sodium or reduced-sodium canned vegetables. Fruits All fresh, dried, or frozen fruit. Canned fruit in natural juice (without  added sugar). Meat and other protein foods Skinless chicken or turkey. Ground chicken or turkey. Pork with fat trimmed off. Fish and seafood. Egg whites. Dried beans, peas, or lentils. Unsalted nuts, nut butters, and seeds. Unsalted canned beans. Lean cuts of beef with fat trimmed off. Low-sodium, lean deli meat. Dairy Low-fat (1%) or fat-free (skim) milk. Fat-free, low-fat, or reduced-fat cheeses. Nonfat, low-sodium ricotta or cottage cheese. Low-fat or nonfat yogurt. Low-fat, low-sodium cheese. Fats and oils Soft margarine without trans fats. Vegetable oil. Low-fat, reduced-fat, or light mayonnaise and salad dressings (reduced-sodium). Canola, safflower, olive, soybean, and sunflower oils. Avocado. Seasoning and other foods Herbs. Spices. Seasoning mixes without salt. Unsalted popcorn and pretzels. Fat-free sweets. What foods are not recommended? The items listed may not be a complete list. Talk with your dietitian about what dietary choices are best for you. Grains Baked goods made with fat, such as croissants, muffins, or some breads. Dry pasta or rice meal packs. Vegetables Creamed or fried vegetables. Vegetables in a cheese sauce. Regular canned vegetables (not low-sodium or reduced-sodium). Regular canned tomato sauce and paste (not low-sodium or reduced-sodium). Regular tomato and vegetable juice (not low-sodium or reduced-sodium). Pickles. Olives. Fruits Canned fruit in a light or heavy syrup. Fried fruit. Fruit in cream or butter sauce. Meat and other protein foods Fatty cuts of meat. Ribs. Fried meat. Bacon. Sausage. Bologna and other processed lunch meats. Salami. Fatback. Hotdogs. Bratwurst. Salted nuts and seeds. Canned beans with added salt. Canned or smoked fish. Whole eggs or egg yolks. Chicken or turkey with skin. Dairy Whole or 2% milk, cream, and half-and-half. Whole or full-fat cream cheese. Whole-fat or sweetened yogurt. Full-fat cheese. Nondairy creamers. Whipped toppings.  Processed cheese and cheese spreads. Fats and oils Butter. Stick margarine. Lard. Shortening. Ghee. Bacon fat. Tropical oils, such as coconut, palm kernel, or palm oil. Seasoning and other foods Salted popcorn and pretzels. Onion salt, garlic salt, seasoned salt, table salt, and sea salt. Worcestershire sauce. Tartar sauce. Barbecue sauce. Teriyaki sauce. Soy sauce, including reduced-sodium. Steak sauce. Canned and packaged gravies. Fish sauce. Oyster sauce. Cocktail sauce. Horseradish that you find on the shelf. Ketchup. Mustard. Meat flavorings and tenderizers. Bouillon cubes. Hot sauce and Tabasco sauce. Premade or packaged marinades. Premade or packaged taco seasonings. Relishes. Regular salad dressings. Where to find more information:  National Heart, Lung, and Blood Institute: www.nhlbi.nih.gov  American Heart Association: www.heart.org Summary  The DASH eating plan is a healthy eating plan that has been shown to reduce high blood pressure (hypertension). It may also reduce your risk for type 2 diabetes, heart disease, and stroke.  With the DASH eating plan, you should limit salt (sodium) intake to 2,300 mg a day. If you have hypertension, you may need to reduce your sodium intake to 1,500 mg a day.  When on the DASH eating plan, aim to eat more fresh fruits and vegetables, whole grains, lean proteins, low-fat dairy, and heart-healthy fats.  Work with your health care provider or diet and nutrition specialist (dietitian) to adjust your eating plan to your individual   calorie needs. This information is not intended to replace advice given to you by your health care provider. Make sure you discuss any questions you have with your health care provider. Document Released: 09/17/2011 Document Revised: 09/21/2016 Document Reviewed: 09/21/2016 Elsevier Interactive Patient Education  2018 Elsevier Inc.  

## 2018-09-19 NOTE — Assessment & Plan Note (Signed)
No previous diagnosis of hypertension and blood pressure prior to last visit has always been typically well controlled He is asymptomatic His blood pressure was initially elevated again today, but it is below goal on manual recheck He was advised again about low-sodium diet At his visit in 2 months, we will follow-up on blood pressure again Advised on checking home blood pressures

## 2018-09-28 LAB — HM DIABETES EYE EXAM

## 2018-12-08 ENCOUNTER — Encounter: Payer: Self-pay | Admitting: Family Medicine

## 2018-12-08 ENCOUNTER — Ambulatory Visit (INDEPENDENT_AMBULATORY_CARE_PROVIDER_SITE_OTHER): Payer: 59 | Admitting: Family Medicine

## 2018-12-08 VITALS — BP 128/81 | HR 80 | Temp 97.8°F | Wt 351.5 lb

## 2018-12-08 DIAGNOSIS — E119 Type 2 diabetes mellitus without complications: Secondary | ICD-10-CM | POA: Diagnosis not present

## 2018-12-08 DIAGNOSIS — E785 Hyperlipidemia, unspecified: Secondary | ICD-10-CM

## 2018-12-08 DIAGNOSIS — G4733 Obstructive sleep apnea (adult) (pediatric): Secondary | ICD-10-CM

## 2018-12-08 DIAGNOSIS — E1165 Type 2 diabetes mellitus with hyperglycemia: Secondary | ICD-10-CM | POA: Diagnosis not present

## 2018-12-08 DIAGNOSIS — F17200 Nicotine dependence, unspecified, uncomplicated: Secondary | ICD-10-CM

## 2018-12-08 DIAGNOSIS — E1169 Type 2 diabetes mellitus with other specified complication: Secondary | ICD-10-CM

## 2018-12-08 LAB — POCT GLYCOSYLATED HEMOGLOBIN (HGB A1C)
Est. average glucose Bld gHb Est-mCnc: 226
Hemoglobin A1C: 9.5 % — AB (ref 4.0–5.6)

## 2018-12-08 MED ORDER — METFORMIN HCL 500 MG PO TABS: ORAL_TABLET | ORAL | 0 refills | Status: DC

## 2018-12-08 NOTE — Patient Instructions (Signed)
Diabetes Mellitus and Nutrition, Adult  When you have diabetes (diabetes mellitus), it is very important to have healthy eating habits because your blood sugar (glucose) levels are greatly affected by what you eat and drink. Eating healthy foods in the appropriate amounts, at about the same times every day, can help you:  · Control your blood glucose.  · Lower your risk of heart disease.  · Improve your blood pressure.  · Reach or maintain a healthy weight.  Every person with diabetes is different, and each person has different needs for a meal plan. Your health care provider may recommend that you work with a diet and nutrition specialist (dietitian) to make a meal plan that is best for you. Your meal plan may vary depending on factors such as:  · The calories you need.  · The medicines you take.  · Your weight.  · Your blood glucose, blood pressure, and cholesterol levels.  · Your activity level.  · Other health conditions you have, such as heart or kidney disease.  How do carbohydrates affect me?  Carbohydrates, also called carbs, affect your blood glucose level more than any other type of food. Eating carbs naturally raises the amount of glucose in your blood. Carb counting is a method for keeping track of how many carbs you eat. Counting carbs is important to keep your blood glucose at a healthy level, especially if you use insulin or take certain oral diabetes medicines.  It is important to know how many carbs you can safely have in each meal. This is different for every person. Your dietitian can help you calculate how many carbs you should have at each meal and for each snack.  Foods that contain carbs include:  · Bread, cereal, rice, pasta, and crackers.  · Potatoes and corn.  · Peas, beans, and lentils.  · Milk and yogurt.  · Fruit and juice.  · Desserts, such as cakes, cookies, ice cream, and candy.  How does alcohol affect me?  Alcohol can cause a sudden decrease in blood glucose (hypoglycemia),  especially if you use insulin or take certain oral diabetes medicines. Hypoglycemia can be a life-threatening condition. Symptoms of hypoglycemia (sleepiness, dizziness, and confusion) are similar to symptoms of having too much alcohol.  If your health care provider says that alcohol is safe for you, follow these guidelines:  · Limit alcohol intake to no more than 1 drink per day for nonpregnant women and 2 drinks per day for men. One drink equals 12 oz of beer, 5 oz of wine, or 1½ oz of hard liquor.  · Do not drink on an empty stomach.  · Keep yourself hydrated with water, diet soda, or unsweetened iced tea.  · Keep in mind that regular soda, juice, and other mixers may contain a lot of sugar and must be counted as carbs.  What are tips for following this plan?    Reading food labels  · Start by checking the serving size on the "Nutrition Facts" label of packaged foods and drinks. The amount of calories, carbs, fats, and other nutrients listed on the label is based on one serving of the item. Many items contain more than one serving per package.  · Check the total grams (g) of carbs in one serving. You can calculate the number of servings of carbs in one serving by dividing the total carbs by 15. For example, if a food has 30 g of total carbs, it would be equal to 2   servings of carbs.  · Check the number of grams (g) of saturated and trans fats in one serving. Choose foods that have low or no amount of these fats.  · Check the number of milligrams (mg) of salt (sodium) in one serving. Most people should limit total sodium intake to less than 2,300 mg per day.  · Always check the nutrition information of foods labeled as "low-fat" or "nonfat". These foods may be higher in added sugar or refined carbs and should be avoided.  · Talk to your dietitian to identify your daily goals for nutrients listed on the label.  Shopping  · Avoid buying canned, premade, or processed foods. These foods tend to be high in fat, sodium,  and added sugar.  · Shop around the outside edge of the grocery store. This includes fresh fruits and vegetables, bulk grains, fresh meats, and fresh dairy.  Cooking  · Use low-heat cooking methods, such as baking, instead of high-heat cooking methods like deep frying.  · Cook using healthy oils, such as olive, canola, or sunflower oil.  · Avoid cooking with butter, cream, or high-fat meats.  Meal planning  · Eat meals and snacks regularly, preferably at the same times every day. Avoid going long periods of time without eating.  · Eat foods high in fiber, such as fresh fruits, vegetables, beans, and whole grains. Talk to your dietitian about how many servings of carbs you can eat at each meal.  · Eat 4-6 ounces (oz) of lean protein each day, such as lean meat, chicken, fish, eggs, or tofu. One oz of lean protein is equal to:  ? 1 oz of meat, chicken, or fish.  ? 1 egg.  ? ¼ cup of tofu.  · Eat some foods each day that contain healthy fats, such as avocado, nuts, seeds, and fish.  Lifestyle  · Check your blood glucose regularly.  · Exercise regularly as told by your health care provider. This may include:  ? 150 minutes of moderate-intensity or vigorous-intensity exercise each week. This could be brisk walking, biking, or water aerobics.  ? Stretching and doing strength exercises, such as yoga or weightlifting, at least 2 times a week.  · Take medicines as told by your health care provider.  · Do not use any products that contain nicotine or tobacco, such as cigarettes and e-cigarettes. If you need help quitting, ask your health care provider.  · Work with a counselor or diabetes educator to identify strategies to manage stress and any emotional and social challenges.  Questions to ask a health care provider  · Do I need to meet with a diabetes educator?  · Do I need to meet with a dietitian?  · What number can I call if I have questions?  · When are the best times to check my blood glucose?  Where to find more  information:  · American Diabetes Association: diabetes.org  · Academy of Nutrition and Dietetics: www.eatright.org  · National Institute of Diabetes and Digestive and Kidney Diseases (NIH): www.niddk.nih.gov  Summary  · A healthy meal plan will help you control your blood glucose and maintain a healthy lifestyle.  · Working with a diet and nutrition specialist (dietitian) can help you make a meal plan that is best for you.  · Keep in mind that carbohydrates (carbs) and alcohol have immediate effects on your blood glucose levels. It is important to count carbs and to use alcohol carefully.  This information is not intended to   replace advice given to you by your health care provider. Make sure you discuss any questions you have with your health care provider.  Document Released: 06/25/2005 Document Revised: 04/28/2017 Document Reviewed: 11/02/2016  Elsevier Interactive Patient Education © 2019 Elsevier Inc.

## 2018-12-08 NOTE — Progress Notes (Signed)
Patient: Dillon Bolton Male    DOB: 06/05/1966   53 y.o.   MRN: [id number] Visit Date: 12/08/2018  Today's Provider: Lavon Paganini, MD   Chief Complaint  Patient presents with  . Diabetes  . Hypertension  . Hyperlipidemia   Subjective:     HPI   Diabetes Mellitus Type II, Follow-up:   Lab Results  Component Value Date   HGBA1C 7.0 (A) 09/01/2018   HGBA1C 7.1 (H) 05/03/2018   Last seen for diabetes 3 months ago.  Management since then includes advised patient to diet and exercise. Patient has declined medications in the past He reports good compliance with treatment. He is not having side effects.  Current symptoms include none and have been stable. Home blood sugar records: n/a  Episodes of hypoglycemia? no   Current Insulin Regimen: None Most Recent Eye Exam: 09/28/2018 Weight trend: fluctuating a bit Prior visit with dietician: no Current diet: well balanced Current exercise: none  ------------------------------------------------------------------------   Hypertension, follow-up:  BP Readings from Last 3 Encounters:  12/08/18 128/81  09/19/18 135/80  09/01/18 (!) 178/90    He was last seen for hypertension 3 months ago.  BP at that visit was 135/80. Management since that visit includes none.He reports good compliance with treatment. He is not having side effects.  He is not exercising. He is adherent to low salt diet.   Outside blood pressures are none. He is experiencing none.  Patient denies chest pain, chest pressure/discomfort, exertional chest pressure/discomfort, fatigue, irregular heart beat, lower extremity edema, palpitations and tachypnea.   Cardiovascular risk factors include hypertension and male gender.  Use of agents associated with hypertension: none.   ------------------------------------------------------------------------    Lipid/Cholesterol, Follow-up:   Last seen for this 3 months ago.  Management since that visit  includes none.  Last Lipid Panel:    Component Value Date/Time   CHOL 167 05/03/2018 1039   TRIG 86 05/03/2018 1039   HDL 35 (L) 05/03/2018 1039   CHOLHDL 4.8 05/03/2018 1039   LDLCALC 115 (H) 05/03/2018 1039    He reports good compliance with treatment. He is not having side effects.   Wt Readings from Last 3 Encounters:  12/08/18 (!) 351 lb 8 oz (159.4 kg)  09/19/18 (!) 358 lb 8 oz (162.6 kg)  09/01/18 (!) 357 lb (161.9 kg)    ------------------------------------------------------------------------   No Known Allergies   Current Outpatient Medications:  .  buPROPion (WELLBUTRIN XL) 300 MG 24 hr tablet, Take 1 tablet (300 mg total) by mouth daily. (Patient not taking: Reported on 12/08/2018), Disp: 30 tablet, Rfl: 2  Review of Systems  HENT: Negative.   Respiratory: Negative.   Cardiovascular: Negative.   Genitourinary: Negative.   Neurological: Negative.     Social History   Tobacco Use  . Smoking status: Current Every Day Smoker    Packs/day: 0.50    Types: Cigarettes    Start date: 55  . Smokeless tobacco: Never Used  . Tobacco comment: started smoking at age 60. Is smoking 0.25-0.75 PPD, which is decreased from 1.5-2 PPD  Substance Use Topics  . Alcohol use: Yes    Alcohol/week: 1.0 - 2.0 standard drinks    Types: 1 - 2 Cans of beer per week    Comment: occasional      Objective:   BP 128/81 (BP Location: Left Arm, Patient Position: Sitting, Cuff Size: Large)   Pulse 80   Temp 97.8 F (36.6 C) (Oral)  Wt (!) 351 lb 8 oz (159.4 kg)   BMI 46.37 kg/m  Vitals:   12/08/18 1104  BP: 128/81  Pulse: 80  Temp: 97.8 F (36.6 C)  TempSrc: Oral  Weight: (!) 351 lb 8 oz (159.4 kg)     Physical Exam Vitals signs reviewed.  Constitutional:      General: He is not in acute distress.    Appearance: Normal appearance. He is well-developed. He is not diaphoretic.  HENT:     Head: Normocephalic and atraumatic.  Eyes:     General: No scleral  icterus.    Conjunctiva/sclera: Conjunctivae normal.     Pupils: Pupils are equal, round, and reactive to light.  Neck:     Musculoskeletal: Neck supple.     Thyroid: No thyromegaly.  Cardiovascular:     Rate and Rhythm: Normal rate and regular rhythm.     Pulses: Normal pulses.     Heart sounds: Normal heart sounds. No murmur.  Pulmonary:     Effort: Pulmonary effort is normal. No respiratory distress.     Breath sounds: Normal breath sounds. No wheezing or rales.  Abdominal:     General: There is no distension.     Palpations: Abdomen is soft.     Tenderness: There is no abdominal tenderness.  Musculoskeletal:     Right lower leg: No edema.     Left lower leg: No edema.  Lymphadenopathy:     Cervical: No cervical adenopathy.  Skin:    General: Skin is warm and dry.     Capillary Refill: Capillary refill takes less than 2 seconds.     Findings: No rash.  Neurological:     Mental Status: He is alert and oriented to person, place, and time.  Psychiatric:        Mood and Affect: Mood normal.        Behavior: Behavior normal.     Results for orders placed or performed in visit on 12/08/18  POCT glycosylated hemoglobin (Hb A1C)  Result Value Ref Range   Hemoglobin A1C 9.5 (A) 4.0 - 5.6 %   HbA1c POC (<> result, manual entry)     HbA1c, POC (prediabetic range)     HbA1c, POC (controlled diabetic range)     Est. average glucose Bld gHb Est-mCnc 226        Assessment & Plan   Problem List Items Addressed This Visit      Respiratory   Sleep apnea    Well-controlled with CPAP Using with good compliance        Endocrine   Hyperlipidemia associated with type 2 diabetes mellitus ([place])    Again discussed the importance of cholesterol control in the setting of diabetes Patient has been very hesitant to take a statin He also states that he loves food and is not willing to give up some things Recheck lipids today and we will discuss statin again      Relevant  Medications   metFORMIN (GLUCOPHAGE) 500 MG tablet   Other Relevant Orders   Comprehensive metabolic panel (Completed)   Lipid panel (Completed)   Type 2 diabetes mellitus with hyperglycemia ([place]) - Primary    Uncontrolled with A1c of 9.5 today Patient has not taken any medications in the past as he has been hesitant to do this We have discussed low-carb diet and exercise but he states he lives with and is not willing to give some up He is up-to-date on screenings and vaccinations He agrees  to try metformin 500 mg twice daily I will have him increase to 1000 mg twice daily if tolerating well after 2 weeks Follow-up in 3 months for recheck A1c Will likely also need a GLP-1 or SGLT2 at that time      Relevant Medications   metFORMIN (GLUCOPHAGE) 500 MG tablet     Other   Morbid obesity ([place])    Weight is down slightly today Encourage diet and exercise      Relevant Medications   metFORMIN (GLUCOPHAGE) 500 MG tablet   Tobacco use disorder    Discussed importance of cessation and health risks of continued smoking Patient remains pre-contemplative about quitting          Return in about 3 months (around 03/08/2019) for diabetes f/u.   The entirety of the information documented in the History of Present Illness, Review of Systems and Physical Exam were personally obtained by me. Portions of this information were initially documented by Tiburcio Pea, CMA and reviewed by me for thoroughness and accuracy.    Virginia Crews, MD, MPH [facility] 12/09/2018 8:30 AM

## 2018-12-09 LAB — LIPID PANEL
Chol/HDL Ratio: 5.5 ratio — ABNORMAL HIGH (ref 0.0–5.0)
Cholesterol, Total: 170 mg/dL (ref 100–199)
HDL: 31 mg/dL — AB (ref >39)
LDL Calculated: 112 mg/dL — ABNORMAL HIGH (ref 0–99)
Triglycerides: 135 mg/dL (ref 0–149)
VLDL Cholesterol Cal: 27 mg/dL (ref 5–40)

## 2018-12-09 LAB — COMPREHENSIVE METABOLIC PANEL
ALT: 23 IU/L (ref 0–44)
AST: 11 IU/L (ref 0–40)
Albumin/Globulin Ratio: 1.4 (ref 1.2–2.2)
Albumin: 3.9 g/dL (ref 3.8–4.9)
Alkaline Phosphatase: 104 IU/L (ref 39–117)
BILIRUBIN TOTAL: 0.4 mg/dL (ref 0.0–1.2)
BUN/Creatinine Ratio: 15 (ref 9–20)
BUN: 11 mg/dL (ref 6–24)
CALCIUM: 9.2 mg/dL (ref 8.7–10.2)
CHLORIDE: 103 mmol/L (ref 96–106)
CO2: 20 mmol/L (ref 20–29)
Creatinine, Ser: 0.75 mg/dL — ABNORMAL LOW (ref 0.76–1.27)
GFR, EST AFRICAN AMERICAN: 122 mL/min/{1.73_m2} (ref >59)
GFR, EST NON AFRICAN AMERICAN: 105 mL/min/{1.73_m2} (ref >59)
GLUCOSE: 242 mg/dL — AB (ref 65–99)
Globulin, Total: 2.7 g/dL (ref 1.5–4.5)
Potassium: 4.3 mmol/L (ref 3.5–5.2)
Sodium: 138 mmol/L (ref 134–144)
TOTAL PROTEIN: 6.6 g/dL (ref 6.0–8.5)

## 2018-12-09 NOTE — Assessment & Plan Note (Signed)
Uncontrolled with A1c of 9.5 today Patient has not taken any medications in the past as he has been hesitant to do this We have discussed low-carb diet and exercise but he states he lives with and is not willing to give some up He is up-to-date on screenings and vaccinations He agrees to try metformin 500 mg twice daily I will have him increase to 1000 mg twice daily if tolerating well after 2 weeks Follow-up in 3 months for recheck A1c Will likely also need a GLP-1 or SGLT2 at that time

## 2018-12-09 NOTE — Assessment & Plan Note (Signed)
Well-controlled with CPAP Using with good compliance

## 2018-12-09 NOTE — Assessment & Plan Note (Signed)
Again discussed the importance of cholesterol control in the setting of diabetes Patient has been very hesitant to take a statin He also states that he loves food and is not willing to give up some things Recheck lipids today and we will discuss statin again

## 2018-12-09 NOTE — Assessment & Plan Note (Signed)
Weight is down slightly today Encourage diet and exercise

## 2018-12-09 NOTE — Assessment & Plan Note (Signed)
Discussed importance of cessation and health risks of continued smoking Patient remains pre-contemplative about quitting

## 2018-12-12 ENCOUNTER — Telehealth: Payer: Self-pay

## 2018-12-12 DIAGNOSIS — E785 Hyperlipidemia, unspecified: Principal | ICD-10-CM

## 2018-12-12 DIAGNOSIS — E1169 Type 2 diabetes mellitus with other specified complication: Secondary | ICD-10-CM

## 2018-12-12 NOTE — Telephone Encounter (Signed)
-----   Message from Virginia Crews, MD sent at 12/12/2018 12:41 PM EST ----- Normal kidney function, liver function, electrolytes.  High blood sugar.  Cholesterol is elevated, especially in setting of diabetes.  Recommend starting Lipitor to lower heart disease and stroke risk.

## 2018-12-12 NOTE — Telephone Encounter (Signed)
LVMTRC 

## 2018-12-13 MED ORDER — ATORVASTATIN CALCIUM 40 MG PO TABS: 40.0000 mg | ORAL_TABLET | Freq: Every day | ORAL | 3 refills | Status: DC

## 2018-12-13 NOTE — Telephone Encounter (Signed)
Patient advised as directed below. Patient agrees to starting Lipitor.

## 2018-12-13 NOTE — Telephone Encounter (Signed)
LMTCB

## 2018-12-13 NOTE — Telephone Encounter (Signed)
Rx sent 

## 2019-01-05 ENCOUNTER — Other Ambulatory Visit: Payer: Self-pay | Admitting: Family Medicine

## 2019-02-28 DIAGNOSIS — G4733 Obstructive sleep apnea (adult) (pediatric): Secondary | ICD-10-CM | POA: Diagnosis not present

## 2019-03-07 ENCOUNTER — Ambulatory Visit: Payer: 59 | Admitting: Family Medicine

## 2019-03-10 ENCOUNTER — Ambulatory Visit (INDEPENDENT_AMBULATORY_CARE_PROVIDER_SITE_OTHER): Payer: 59 | Admitting: Family Medicine

## 2019-03-10 ENCOUNTER — Encounter: Payer: Self-pay | Admitting: Family Medicine

## 2019-03-10 DIAGNOSIS — E1169 Type 2 diabetes mellitus with other specified complication: Secondary | ICD-10-CM

## 2019-03-10 DIAGNOSIS — E785 Hyperlipidemia, unspecified: Secondary | ICD-10-CM | POA: Diagnosis not present

## 2019-03-10 DIAGNOSIS — E1165 Type 2 diabetes mellitus with hyperglycemia: Secondary | ICD-10-CM | POA: Diagnosis not present

## 2019-03-10 NOTE — Progress Notes (Signed)
Patient: Dillon Bolton Male    DOB: 23-Dec-1965   53 y.o.   MRN: [id number] Visit Date: 03/10/2019  Today's Provider: Lavon Paganini, MD   Chief Complaint  Patient presents with  . Diabetes   Subjective:    Virtual Visit via Video Note  I connected with Dillon Bolton on 03/10/19 at 10:00 AM EDT by a video enabled telemedicine application and verified that I am speaking with the correct person using two identifiers.   Patient location: home Provider location: home office  Persons involved in the visit: patient, provider   I discussed the limitations of evaluation and management by telemedicine and the availability of in person appointments. The patient expressed understanding and agreed to proceed.  HPI  Diabetes Mellitus Type II, Follow-up:   RecentLabs       Lab Results  Component Value Date   HGBA1C 7.0 (A) 09/01/2018   HGBA1C 7.1 (H) 05/03/2018     Last seen for diabetes 3 months ago.  Management since then includes advised patient to start Metformin 500 mg BID and increase to 1000 mg BID if tolerating well in 2 weeks. He reports good compliance with treatment. He is not having side effects.  Current symptoms include none  Home blood sugar records: is not checking FBS  Episodes of hypoglycemia? no              Current Insulin Regimen: None Most Recent Eye Exam: 09/28/2018 Weight trend: decreasing. Patient reports weight loss of 10 lbs. Prior visit with dietician: no Current diet: well balanced Current exercise: none  HLD: Started atorvastatin ~3 months ago. Tolerating well  Working on increasing exercise, but recently bought a Financial trader and used to use push mowing as exercise.  Low carb diet and has ost ~10 lbs. ------------------------------------------------------------------------  No Known Allergies   Current Outpatient Medications:  .  atorvastatin (LIPITOR) 40 MG tablet, Take 1 tablet (40 mg total) by mouth daily., Disp: 90  tablet, Rfl: 3 .  metFORMIN (GLUCOPHAGE) 1000 MG tablet, Take 1 tablet (1,000 mg total) by mouth 2 (two) times daily with a meal., Disp: 60 tablet, Rfl: 3  Review of Systems  Constitutional: Negative.   Respiratory: Negative.   Cardiovascular: Negative.   Musculoskeletal: Negative.     Social History   Tobacco Use  . Smoking status: Current Every Day Smoker    Packs/day: 0.50    Types: Cigarettes    Start date: 78  . Smokeless tobacco: Never Used  . Tobacco comment: started smoking at age 89. Is smoking 0.25-0.75 PPD, which is decreased from 1.5-2 PPD  Substance Use Topics  . Alcohol use: Yes    Alcohol/week: 1.0 - 2.0 standard drinks    Types: 1 - 2 Cans of beer per week    Comment: occasional      Objective:   There were no vitals taken for this visit. There were no vitals filed for this visit.   Physical Exam Constitutional:      Appearance: Normal appearance.  Pulmonary:     Effort: Pulmonary effort is normal. No respiratory distress.  Neurological:     Mental Status: He is alert and oriented to person, place, and time.  Psychiatric:        Mood and Affect: Mood normal.        Behavior: Behavior normal.        Assessment & Plan    I discussed the assessment and treatment  plan with the patient. The patient was provided an opportunity to ask questions and all were answered. The patient agreed with the plan and demonstrated an understanding of the instructions.   The patient was advised to call back or seek an in-person evaluation if the symptoms worsen or if the condition fails to improve as anticipated.  Problem List Items Addressed This Visit      Endocrine   Hyperlipidemia associated with type 2 diabetes mellitus ([place]) - Primary    Started atorvastatin 40 mg daily about 3 months ago Patient is tolerating this medication well without any side effects We will continue this medication Recheck lipid panel and CMP      Relevant Orders   Comprehensive  metabolic panel   Lipid panel   Type 2 diabetes mellitus with hyperglycemia (H[facility])    Uncontrolled with last A1c 9.5 He is doing well on metformin 1000 mg twice daily without any side effects He states that he is working on a low-carb diet and exercise Up-to-date on screenings and vaccinations Recheck hemoglobin A1c Patient not interested in checking home CBGs Pending A1c, consider addition of Jardiance      Relevant Orders   Hemoglobin A1c   Comprehensive metabolic panel     Other   Morbid obesity ([place])    Related patient on his weight loss Continue to encourage diet and exercise      Relevant Orders   Comprehensive metabolic panel       Return in about 3 months (around 06/10/2019) for DM f/u.   The entirety of the information documented in the History of Present Illness, Review of Systems and Physical Exam were personally obtained by me. Portions of this information were initially documented by Tiburcio Pea, CMA and reviewed by me for thoroughness and accuracy.    , Dionne Bucy, MD MPH [facility]

## 2019-03-10 NOTE — Assessment & Plan Note (Signed)
Uncontrolled with last A1c 9.5 He is doing well on metformin 1000 mg twice daily without any side effects He states that he is working on a low-carb diet and exercise Up-to-date on screenings and vaccinations Recheck hemoglobin A1c Patient not interested in checking home CBGs Pending A1c, consider addition of Jardiance

## 2019-03-10 NOTE — Assessment & Plan Note (Signed)
Started atorvastatin 40 mg daily about 3 months ago Patient is tolerating this medication well without any side effects We will continue this medication Recheck lipid panel and CMP

## 2019-03-10 NOTE — Assessment & Plan Note (Signed)
Related patient on his weight loss Continue to encourage diet and exercise

## 2019-03-16 ENCOUNTER — Telehealth: Payer: Self-pay

## 2019-03-16 LAB — COMPREHENSIVE METABOLIC PANEL
ALT: 27 IU/L (ref 0–44)
AST: 17 IU/L (ref 0–40)
Albumin/Globulin Ratio: 1.7 (ref 1.2–2.2)
Albumin: 4.3 g/dL (ref 3.8–4.9)
Alkaline Phosphatase: 101 IU/L (ref 39–117)
BUN/Creatinine Ratio: 17 (ref 9–20)
BUN: 13 mg/dL (ref 6–24)
Bilirubin Total: 0.5 mg/dL (ref 0.0–1.2)
CO2: 19 mmol/L — ABNORMAL LOW (ref 20–29)
Calcium: 9.2 mg/dL (ref 8.7–10.2)
Chloride: 104 mmol/L (ref 96–106)
Creatinine, Ser: 0.76 mg/dL (ref 0.76–1.27)
GFR calc Af Amer: 121 mL/min/{1.73_m2} (ref >59)
GFR calc non Af Amer: 105 mL/min/{1.73_m2} (ref >59)
Globulin, Total: 2.5 g/dL (ref 1.5–4.5)
Glucose: 124 mg/dL — ABNORMAL HIGH (ref 65–99)
Potassium: 4.1 mmol/L (ref 3.5–5.2)
Sodium: 141 mmol/L (ref 134–144)
Total Protein: 6.8 g/dL (ref 6.0–8.5)

## 2019-03-16 LAB — LIPID PANEL
Chol/HDL Ratio: 3.5 ratio (ref 0.0–5.0)
Cholesterol, Total: 122 mg/dL (ref 100–199)
HDL: 35 mg/dL — ABNORMAL LOW (ref >39)
LDL Calculated: 71 mg/dL (ref 0–99)
Triglycerides: 81 mg/dL (ref 0–149)
VLDL Cholesterol Cal: 16 mg/dL (ref 5–40)

## 2019-03-16 LAB — HEMOGLOBIN A1C
Est. average glucose Bld gHb Est-mCnc: 154 mg/dL
Hgb A1c MFr Bld: 7 % — ABNORMAL HIGH (ref 4.8–5.6)

## 2019-03-16 NOTE — Telephone Encounter (Signed)
-----   Message from Virginia Crews, MD sent at 03/16/2019 11:55 AM EDT ----- A1c has improved from 9.5 to 7.  This is almost to goal of <7.  Would need to add another medication to lower this as Metformin is at max dose, but if he'd like to work on diet and exercise first, that is fine.  LDL (bad cholesterol) is improving but not quite to goal.  If taking atorvastatin with good compliance, need to increase dose to 80mg  daily. Ok to eRx 90 d supply with 1 refill if he agrees.  Other labs normal

## 2019-03-16 NOTE — Telephone Encounter (Signed)
Patient notified about results and instructions. He states that he has not been taking his cholesterol medication consistently, but will start. He will like to hold off on increasing the dose.

## 2019-05-08 ENCOUNTER — Encounter: Payer: 59 | Admitting: Family Medicine

## 2019-05-14 ENCOUNTER — Other Ambulatory Visit: Payer: Self-pay | Admitting: Family Medicine

## 2019-06-15 NOTE — Progress Notes (Signed)
Patient: Dillon Bolton Male    DOB: 1966/05/17   53 y.o.   MRN: [id number] Visit Date: 06/16/2019  Today's Provider: Lavon Paganini, MD   Chief Complaint  Patient presents with  . Hypertension  . Diabetes   Subjective:    I, Tiburcio Pea, CMA, am acting as a Education administrator for Lavon Paganini, MD.    HPI  Diabetes Mellitus Type II, Follow-up:   Lab Results  Component Value Date   HGBA1C 7.0 (H) 03/15/2019   HGBA1C 9.5 (A) 12/08/2018   HGBA1C 7.0 (A) 09/01/2018   Last seen for diabetes 3 months ago.  Management since then includes no change. He reports good compliance with treatment. He is not having side effects.  Current symptoms include none  Home blood sugar records: not being checked  Episodes of hypoglycemia? no   Current Insulin Regimen: none Most Recent Eye Exam: 09/28/2018 Weight trend: stable Prior visit with dietician: no Current diet: low carb Current exercise: none  ------------------------------------------------------------------------   BP, follow-up:  BP Readings from Last 3 Encounters:  06/16/19 (!) 144/73  12/08/18 128/81  09/19/18 135/80    He was last seen for hypertension 3 months ago.  BP at that visit was not taken. Patient had a virtual visit. Management since that visit includes no change.He reports good compliance with treatment. He is not having side effects.  He is not exercising. He is not adherent to low salt diet.   Outside blood pressures are not being checked. He is experiencing none.  Patient denies chest pain, chest pressure/discomfort, claudication, dyspnea, exertional chest pressure/discomfort, fatigue, irregular heart beat, lower extremity edema, near-syncope, orthopnea, palpitations, paroxysmal nocturnal dyspnea, syncope and tachypnea.   Cardiovascular risk factors include diabetes mellitus, dyslipidemia, hypertension, male gender and obesity (BMI >= 30 kg/m2).  Use of agents associated with hypertension: none.    ------------------------------------------------------------------------  Lipid/Cholesterol, Follow-up:   Last seen for this 3 months ago.  Management changes since that visit include patient advised to take medication regularly. . Last Lipid Panel:    Component Value Date/Time   CHOL 122 03/15/2019 0958   TRIG 81 03/15/2019 0958   HDL 35 (L) 03/15/2019 0958   CHOLHDL 3.5 03/15/2019 0958   LDLCALC 71 03/15/2019 0958    Risk factors for vascular disease include diabetes mellitus, hypercholesterolemia and hypertension  He reports good compliance with treatment. He is not having side effects.  Current symptoms include none  Weight trend: stable Prior visit with dietician: no Current diet: low carb Current exercise: none  Wt Readings from Last 3 Encounters:  06/16/19 (!) 326 lb 3.2 oz (148 kg)  12/08/18 (!) 351 lb 8 oz (159.4 kg)  09/19/18 (!) 358 lb 8 oz (162.6 kg)    ------------------------------------------------------------------- He had a dental infection earlier this month and was told blood sugar was good at that time  He reports a lot of stress currently.  He is not ready to quit smoking   No Known Allergies   Current Outpatient Medications:  .  atorvastatin (LIPITOR) 40 MG tablet, Take 1 tablet (40 mg total) by mouth daily., Disp: 90 tablet, Rfl: 3 .  metFORMIN (GLUCOPHAGE) 1000 MG tablet, TAKE 1 TABLET (1,000 MG TOTAL) BY MOUTH 2 (TWO) TIMES DAILY WITH A MEAL., Disp: 60 tablet, Rfl: 1  Review of Systems  Constitutional: Negative.   Respiratory: Negative.   Cardiovascular: Negative.   Genitourinary: Negative.   Musculoskeletal: Negative.   Neurological: Negative.     Social History  Tobacco Use  . Smoking status: Current Every Day Smoker    Packs/day: 0.50    Types: Cigarettes    Start date: 82  . Smokeless tobacco: Never Used  . Tobacco comment: started smoking at age 56. Is smoking 0.25-0.75 PPD, which is decreased from 1.5-2 PPD  Substance  Use Topics  . Alcohol use: Yes    Alcohol/week: 1.0 - 2.0 standard drinks    Types: 1 - 2 Cans of beer per week    Comment: occasional      Objective:   BP (!) 144/73 (BP Location: Right Arm, Patient Position: Sitting, Cuff Size: Large)   Pulse 83   Temp (!) 96.8 F (36 C) (Temporal)   Wt (!) 326 lb 3.2 oz (148 kg)   SpO2 98%   BMI 43.04 kg/m  Vitals:   06/16/19 0946  BP: (!) 144/73  Pulse: 83  Temp: (!) 96.8 F (36 C)  TempSrc: Temporal  SpO2: 98%  Weight: (!) 326 lb 3.2 oz (148 kg)  Body mass index is 43.04 kg/m.   Physical Exam Vitals signs reviewed.  Constitutional:      General: He is not in acute distress.    Appearance: Normal appearance. He is not diaphoretic.  HENT:     Head: Normocephalic and atraumatic.  Eyes:     General: No scleral icterus.    Conjunctiva/sclera: Conjunctivae normal.  Neck:     Musculoskeletal: Neck supple.  Cardiovascular:     Rate and Rhythm: Normal rate and regular rhythm.     Pulses: Normal pulses.     Heart sounds: Normal heart sounds. No murmur.  Pulmonary:     Effort: Pulmonary effort is normal. No respiratory distress.     Breath sounds: Normal breath sounds. No wheezing or rhonchi.  Abdominal:     General: There is no distension.     Palpations: Abdomen is soft.     Tenderness: There is no abdominal tenderness.  Musculoskeletal:     Right lower leg: No edema.     Left lower leg: No edema.  Lymphadenopathy:     Cervical: No cervical adenopathy.  Skin:    General: Skin is warm and dry.     Capillary Refill: Capillary refill takes less than 2 seconds.     Findings: No rash.  Neurological:     Mental Status: He is alert and oriented to person, place, and time.     Cranial Nerves: No cranial nerve deficit.  Psychiatric:        Mood and Affect: Mood normal.        Behavior: Behavior normal.     Diabetic Foot Exam - Simple   Simple Foot Form Diabetic Foot exam was performed with the following findings: Yes  06/16/2019 12:00 PM  Visual Inspection No deformities, no ulcerations, no other skin breakdown bilaterally: Yes Sensation Testing Intact to touch and monofilament testing bilaterally: Yes Pulse Check Posterior Tibialis and Dorsalis pulse intact bilaterally: Yes Comments      No results found for any visits on 06/16/19.     Assessment & Plan    Problem List Items Addressed This Visit      Cardiovascular and Mediastinum   Hypertension associated with diabetes ([place])    BP elevated again today Patient decliens medications Will give 3 months of lifestyle interventions, but will reconsider medications at next visit        Endocrine   Hyperlipidemia associated with type 2 diabetes mellitus ([place])  Reviewed last lipid panel Atorvastatin dose was increased to 80 mg daily - will continue Plan to recheck lipids at next visit      T2DM (type 2 diabetes mellitus) ([place])    Well controlled Associated with HTN and HLD Continue current medications Foot exam today Urine microalbumin at next visit - can not provide a urine sample today On Statin Discussed diet and exercise F/u in 3 months       Relevant Orders   POCT HgB A1C (Completed)     Other   Morbid obesity ([place])    Discussed importance of healthy weight management Discussed diet and exercise       Tobacco use disorder    Discussed importance of cessation and health risks of continued smoking Patient remains pre-contemplative about quitting       Other Visit Diagnoses    Need for influenza vaccination    -  Primary   Relevant Orders   Flu Vaccine QUAD 36+ mos IM (Completed)       Return in about 3 months (around 09/15/2019) for CPE.   The entirety of the information documented in the History of Present Illness, Review of Systems and Physical Exam were personally obtained by me. Portions of this information were initially documented by Tiburcio Pea, CMA and reviewed by me for thoroughness and accuracy.     [place], Dionne Bucy, MD MPH [facility]

## 2019-06-16 ENCOUNTER — Ambulatory Visit (INDEPENDENT_AMBULATORY_CARE_PROVIDER_SITE_OTHER): Payer: 59 | Admitting: Family Medicine

## 2019-06-16 ENCOUNTER — Other Ambulatory Visit: Payer: Self-pay

## 2019-06-16 ENCOUNTER — Encounter: Payer: Self-pay | Admitting: Family Medicine

## 2019-06-16 VITALS — BP 144/75 | HR 83 | Temp 96.8°F | Wt 326.2 lb

## 2019-06-16 DIAGNOSIS — I152 Hypertension secondary to endocrine disorders: Secondary | ICD-10-CM

## 2019-06-16 DIAGNOSIS — E1159 Type 2 diabetes mellitus with other circulatory complications: Secondary | ICD-10-CM

## 2019-06-16 DIAGNOSIS — I10 Essential (primary) hypertension: Secondary | ICD-10-CM

## 2019-06-16 DIAGNOSIS — Z23 Encounter for immunization: Secondary | ICD-10-CM

## 2019-06-16 DIAGNOSIS — E1169 Type 2 diabetes mellitus with other specified complication: Secondary | ICD-10-CM | POA: Diagnosis not present

## 2019-06-16 DIAGNOSIS — E785 Hyperlipidemia, unspecified: Secondary | ICD-10-CM

## 2019-06-16 DIAGNOSIS — E1165 Type 2 diabetes mellitus with hyperglycemia: Secondary | ICD-10-CM

## 2019-06-16 DIAGNOSIS — F17200 Nicotine dependence, unspecified, uncomplicated: Secondary | ICD-10-CM

## 2019-06-16 LAB — POCT GLYCOSYLATED HEMOGLOBIN (HGB A1C): Hemoglobin A1C: 6.9 % — AB (ref 4.0–5.6)

## 2019-06-16 NOTE — Assessment & Plan Note (Signed)
Discussed importance of healthy weight management Discussed diet and exercise  

## 2019-06-16 NOTE — Assessment & Plan Note (Signed)
Discussed importance of cessation and health risks of continued smoking Patient remains pre-contemplative about quitting

## 2019-06-16 NOTE — Assessment & Plan Note (Signed)
BP elevated again today Patient decliens medications Will give 3 months of lifestyle interventions, but will reconsider medications at next visit

## 2019-06-16 NOTE — Assessment & Plan Note (Signed)
Reviewed last lipid panel Atorvastatin dose was increased to 80 mg daily - will continue Plan to recheck lipids at next visit

## 2019-06-16 NOTE — Assessment & Plan Note (Addendum)
Well controlled Associated with HTN and HLD Continue current medications Foot exam today Urine microalbumin at next visit - can not provide a urine sample today On Statin Discussed diet and exercise F/u in 3 months

## 2019-07-09 ENCOUNTER — Other Ambulatory Visit: Payer: Self-pay | Admitting: Family Medicine

## 2019-09-28 ENCOUNTER — Other Ambulatory Visit: Payer: Self-pay

## 2019-09-28 ENCOUNTER — Ambulatory Visit (INDEPENDENT_AMBULATORY_CARE_PROVIDER_SITE_OTHER): Payer: 59 | Admitting: Family Medicine

## 2019-09-28 ENCOUNTER — Encounter: Payer: Self-pay | Admitting: Family Medicine

## 2019-09-28 VITALS — BP 118/72 | HR 98 | Temp 96.9°F | Resp 16 | Ht 73.0 in | Wt 340.0 lb

## 2019-09-28 DIAGNOSIS — E785 Hyperlipidemia, unspecified: Secondary | ICD-10-CM | POA: Diagnosis not present

## 2019-09-28 DIAGNOSIS — Z23 Encounter for immunization: Secondary | ICD-10-CM | POA: Diagnosis not present

## 2019-09-28 DIAGNOSIS — E1169 Type 2 diabetes mellitus with other specified complication: Secondary | ICD-10-CM

## 2019-09-28 DIAGNOSIS — I152 Hypertension secondary to endocrine disorders: Secondary | ICD-10-CM

## 2019-09-28 DIAGNOSIS — I10 Essential (primary) hypertension: Secondary | ICD-10-CM

## 2019-09-28 DIAGNOSIS — F17200 Nicotine dependence, unspecified, uncomplicated: Secondary | ICD-10-CM | POA: Diagnosis not present

## 2019-09-28 DIAGNOSIS — R079 Chest pain, unspecified: Secondary | ICD-10-CM | POA: Diagnosis not present

## 2019-09-28 DIAGNOSIS — Z0000 Encounter for general adult medical examination without abnormal findings: Secondary | ICD-10-CM

## 2019-09-28 DIAGNOSIS — E1159 Type 2 diabetes mellitus with other circulatory complications: Secondary | ICD-10-CM

## 2019-09-28 NOTE — Assessment & Plan Note (Signed)
Again discussed the importance of cessation and the health risks of continued smoking Patient remains precontemplative about quitting We will continue to reassess

## 2019-09-28 NOTE — Addendum Note (Signed)
Addended by: Shawna Orleans on: 09/28/2019 04:05 PM   Modules accepted: Orders

## 2019-09-28 NOTE — Assessment & Plan Note (Addendum)
Previously well controlled Associated with hypertension hyperlipidemia as well as morbid obesity Continue metformin at current dose Up-to-date on foot exam and vaccinations Urine microalbumin due but unable to give urine sample - will try again at next visit Advised that he is due for an eye exam On statin Discussed diet and exercise Recheck A1c Follow-up in 6 months

## 2019-09-28 NOTE — Assessment & Plan Note (Signed)
Reviewed last lipid panel and LDL is almost to goal Goal LDL less than 70 in the setting of diabetes Continue atorvastatin 80 mg daily Recheck FLP and CMP

## 2019-09-28 NOTE — Patient Instructions (Addendum)
You are due for an eye exam    Preventive Care 53-53 Years Old, Male Preventive care refers to lifestyle choices and visits with your health care provider that can promote health and wellness. This includes:  A yearly physical exam. This is also called an annual well check.  Regular dental and eye exams.  Immunizations.  Screening for certain conditions.  Healthy lifestyle choices, such as eating a healthy diet, getting regular exercise, not using drugs or products that contain nicotine and tobacco, and limiting alcohol use. What can I expect for my preventive care visit? Physical exam Your health care provider will check:  Height and weight. These may be used to calculate body mass index (BMI), which is a measurement that tells if you are at a healthy weight.  Heart rate and blood pressure.  Your skin for abnormal spots. Counseling Your health care provider may ask you questions about:  Alcohol, tobacco, and drug use.  Emotional well-being.  Home and relationship well-being.  Sexual activity.  Eating habits.  Work and work Statistician. What immunizations do I need?  Influenza (flu) vaccine  This is recommended every year. Tetanus, diphtheria, and pertussis (Tdap) vaccine  You may need a Td booster every 10 years. Varicella (chickenpox) vaccine  You may need this vaccine if you have not already been vaccinated. Zoster (shingles) vaccine  You may need this after age 53. Measles, mumps, and rubella (MMR) vaccine  You may need at least one dose of MMR if you were born in 1957 or later. You may also need a second dose. Pneumococcal conjugate (PCV13) vaccine  You may need this if you have certain conditions and were not previously vaccinated. Pneumococcal polysaccharide (PPSV23) vaccine  You may need one or two doses if you smoke cigarettes or if you have certain conditions. Meningococcal conjugate (MenACWY) vaccine  You may need this if you have certain  conditions. Hepatitis A vaccine  You may need this if you have certain conditions or if you travel or work in places where you may be exposed to hepatitis A. Hepatitis B vaccine  You may need this if you have certain conditions or if you travel or work in places where you may be exposed to hepatitis B. Haemophilus influenzae type b (Hib) vaccine  You may need this if you have certain risk factors. Human papillomavirus (HPV) vaccine  If recommended by your health care provider, you may need three doses over 6 months. You may receive vaccines as individual doses or as more than one vaccine together in one shot (combination vaccines). Talk with your health care provider about the risks and benefits of combination vaccines. What tests do I need? Blood tests  Lipid and cholesterol levels. These may be checked every 5 years, or more frequently if you are over 19 years old.  Hepatitis C test.  Hepatitis B test. Screening  Lung cancer screening. You may have this screening every year starting at age 53 if you have a 30-pack-year history of smoking and currently smoke or have quit within the past 15 years.  Prostate cancer screening. Recommendations will vary depending on your family history and other risks.  Colorectal cancer screening. All adults should have this screening starting at age 53 and continuing until age 53. Your health care provider may recommend screening at age 7 if you are at increased risk. You will have tests every 1-10 years, depending on your results and the type of screening test.  Diabetes screening. This is done by  checking your blood sugar (glucose) after you have not eaten for a while (fasting). You may have this done every 1-3 years.  Sexually transmitted disease (STD) testing. Follow these instructions at home: Eating and drinking  Eat a diet that includes fresh fruits and vegetables, whole grains, lean protein, and low-fat dairy products.  Take vitamin and  mineral supplements as recommended by your health care provider.  Do not drink alcohol if your health care provider tells you not to drink.  If you drink alcohol: ? Limit how much you have to 0-2 drinks a day. ? Be aware of how much alcohol is in your drink. In the [place] one drink equals one 12 oz bottle of beer (355 mL), one 5 oz glass of wine (148 mL), or one 1 oz glass of hard liquor (44 mL). Lifestyle  Take daily care of your teeth and gums.  Stay active. Exercise for at least 30 minutes on 5 or more days each week.  Do not use any products that contain nicotine or tobacco, such as cigarettes, e-cigarettes, and chewing tobacco. If you need help quitting, ask your health care provider.  If you are sexually active, practice safe sex. Use a condom or other form of protection to prevent STIs (sexually transmitted infections).  Talk with your health care provider about taking a low-dose aspirin every day starting at age 74. What's next?  Go to your health care provider once a year for a well check visit.  Ask your health care provider how often you should have your eyes and teeth checked.  Stay up to date on all vaccines. This information is not intended to replace advice given to you by your health care provider. Make sure you discuss any questions you have with your health care provider. Document Released: 10/25/2015 Document Revised: 09/22/2018 Document Reviewed: 09/22/2018 Elsevier Patient Education  2020 [employer].

## 2019-09-28 NOTE — Assessment & Plan Note (Signed)
Well controlled Not on any medications-continue lifestyle interventions Recheck metabolic panel F/u in 6 months

## 2019-09-28 NOTE — Assessment & Plan Note (Signed)
Discussed importance of healthy weight management Discussed diet and exercise  

## 2019-09-28 NOTE — Assessment & Plan Note (Signed)
Suspect non-cardiac in origin given history, as pain is sharp, brief, nonexertional, bilateral, and has no associated symptoms He does have significant risk factors for heart disease, however (T2DM, HLD, and morbid obesity) EKG reassuring If recurs or develops red flag symptoms, will have him evaluated by cardiology Discussed worrisome chest pain symptoms and advised on ER precautions

## 2019-09-28 NOTE — Progress Notes (Signed)
Patient: Dillon Bolton, Male    DOB: [birth year], 53 y.o.   MRN: [id number] Visit Date: 09/28/2019  Today's Provider: Lavon Paganini, MD   Chief Complaint  Patient presents with  . Annual Exam   Subjective:     Annual physical exam Dillon Bolton is a 53 y.o. male who presents today for health maintenance and complete physical. He feels well. He reports exercising no. He reports he is sleeping fairly well. 05/03/2018 CPE 01/06/2017 Colonoscopy-hyperplastic polyps,2 tubular adenoma, 1 sessile serrated adenoma ----------------------------------------------------------------- Patient reports he has had two episodes of chest pain in the last three months. Patient reports pain lasts <2 minutes. Patient reports he took Aspirin both time, and reports that helped with pain. Patient denies any arm pain, diaphoresis, jaw pain or shortness of breath.   He was moving, but not exerting himself when it occured Tried stretching - didn't help Better after taking  No exertional chest pain  Last one was 2 weeks ago. Currently asymptomatic.  Patient reports that his wife is concerned about his belly hanging lower on one side. Patient denies any pain.   Review of Systems  Constitutional: Negative.   HENT: Negative.   Eyes: Negative.   Respiratory: Positive for apnea.   Cardiovascular: Positive for chest pain.  Gastrointestinal: Negative.   Endocrine: Negative.   Genitourinary: Negative.   Musculoskeletal: Positive for arthralgias.  Skin: Negative.   Allergic/Immunologic: Negative.   Neurological: Negative.   Hematological: Negative.   Psychiatric/Behavioral: Negative.     Social History      He  reports that he has been smoking cigarettes. He started smoking about 33 years ago. He has been smoking about 0.50 packs per day. He has never used smokeless tobacco. He reports current alcohol use of about 1.0 - 2.0 standard drinks of alcohol per week. He reports that he does not use  drugs.       Social History   Socioeconomic History  . Marital status: Married    Spouse name: Elana  . Number of children: 3  . Years of education: 66  . Highest education level: Associate degree: academic program  Occupational History  . Occupation: works on Opt[employer]: [employer]    Comment: [employer]  Tobacco Use  . Smoking status: Current Every Day Smoker    Packs/day: 0.50    Types: Cigarettes    Start date: 34  . Smokeless tobacco: Never Used  . Tobacco comment: started smoking at age 49. Is smoking 0.25-0.75 PPD, which is decreased from 1.5-2 PPD  Substance and Sexual Activity  . Alcohol use: Yes    Alcohol/week: 1.0 - 2.0 standard drinks    Types: 1 - 2 Cans of beer per week    Comment: occasional  . Drug use: No  . Sexual activity: Yes    Partners: Female  Other Topics Concern  . Not on file  Social History Narrative   Pt has 3 adopted children.   Social Determinants of Health   Financial Resource Strain:   . Difficulty of Paying Living Expenses: Not on file  Food Insecurity:   . Worried About Charity fundraiser in the Last Year: Not on file  . Ran Out of Food in the Last Year: Not on file  Transportation Needs:   . Lack of Transportation (Medical): Not on file  . Lack of Transportation (Non-Medical): Not on file  Physical Activity:   . Days of Exercise per Week:  Not on file  . Minutes of Exercise per Session: Not on file  Stress:   . Feeling of Stress : Not on file  Social Connections:   . Frequency of Communication with Friends and Family: Not on file  . Frequency of Social Gatherings with Friends and Family: Not on file  . Attends Religious Services: Not on file  . Active Member of Clubs or Organizations: Not on file  . Attends Archivist Meetings: Not on file  . Marital Status: Not on file    Past Medical History:  Diagnosis Date  . Sleep apnea      Patient Active Problem List   Diagnosis Date Noted  . Hypertension  associated with diabetes ([place]) 06/16/2019  . T2DM (type 2 diabetes mellitus) ([place]) 05/18/2018  . Hyperlipidemia associated with type 2 diabetes mellitus ([place]) 05/17/2018  . Morbid obesity ([place]) 11/09/2017  . Tobacco use disorder 11/09/2017  . Psoriasis 11/09/2017  . Sleep apnea     Past Surgical History:  Procedure Laterality Date  . COLONOSCOPY WITH PROPOFOL N/A 01/06/2018   Procedure: COLONOSCOPY WITH PROPOFOL;  Surgeon: Jonathon Bellows, MD;  Location: [facility] END[facility]COPY;  Service: Gastroenterology;  Laterality: N/A;  . KNEE SURGERY Bilateral 1975   removed osteochondromas at age 84  . NOSE SURGERY     to help with OSA, opened up passages  . TONSILLECTOMY    . WISDOM TOOTH EXTRACTION      Family History        Family Status  Relation Name Status  . Mother  Alive  . Father  Alive  . Sister half sister Alive  . Brother half brother Alive  . Brother half brother Alive  . Neg Hx  (Not Specified)        His family history includes Colon cancer in his father; Hypertension in his brother and mother. There is no history of Prostate cancer.      No Known Allergies   Current Outpatient Medications:  .  atorvastatin (LIPITOR) 40 MG tablet, Take 1 tablet (40 mg total) by mouth daily., Disp: 90 tablet, Rfl: 3 .  metFORMIN (GLUCOPHAGE) 1000 MG tablet, TAKE 1 TABLET (1,000 MG TOTAL) BY MOUTH 2 (TWO) TIMES DAILY WITH A MEAL., Disp: 60 tablet, Rfl: 1   Patient Care Team: Virginia Crews, MD as PCP - General (Family Medicine)    Objective:    Vitals: BP 118/72 (BP Location: Left Arm, Patient Position: Sitting, Cuff Size: Large)   Pulse 98   Temp (!) 96.9 F (36.1 C) (Temporal)   Resp 16   Ht 6\' 1"  (1.854 m)   Wt (!) 340 lb (154.2 kg)   BMI 44.86 kg/m    Vitals:   09/28/19 1457  BP: 118/72  Pulse: 98  Resp: 16  Temp: (!) 96.9 F (36.1 C)  TempSrc: Temporal  Weight: (!) 340 lb (154.2 kg)  Height: 6\' 1"  (1.854 m)     Physical Exam Vitals reviewed.  Constitutional:       General: He is not in acute distress.    Appearance: Normal appearance. He is well-developed. He is not diaphoretic.  HENT:     Head: Normocephalic and atraumatic.     Right Ear: Tympanic membrane, ear canal and external ear normal.     Left Ear: Tympanic membrane, ear canal and external ear normal.  Eyes:     General: No scleral icterus.    Conjunctiva/sclera: Conjunctivae normal.     Pupils: Pupils are equal, round,  and reactive to light.  Neck:     Thyroid: No thyromegaly.  Cardiovascular:     Rate and Rhythm: Normal rate and regular rhythm.     Heart sounds: Normal heart sounds. No murmur.  Pulmonary:     Effort: Pulmonary effort is normal. No respiratory distress.     Breath sounds: Normal breath sounds. No wheezing or rales.  Abdominal:     General: There is no distension.     Palpations: Abdomen is soft.     Tenderness: There is no abdominal tenderness. There is no guarding or rebound.     Comments: Some asymmetry of adipose tissue along lower abd wall  Musculoskeletal:        General: No deformity.     Cervical back: Neck supple.     Right lower leg: No edema.     Left lower leg: No edema.  Lymphadenopathy:     Cervical: No cervical adenopathy.  Skin:    General: Skin is warm and dry.     Capillary Refill: Capillary refill takes less than 2 seconds.     Findings: No rash.  Neurological:     Mental Status: He is alert and oriented to person, place, and time.  Psychiatric:        Mood and Affect: Mood normal.        Behavior: Behavior normal.        Thought Content: Thought content normal.      Depression Screen PHQ 2/9 Scores 09/28/2019 06/16/2019 05/03/2018 11/09/2017  PHQ - 2 Score 0 0 1 0  PHQ- 9 Score 0 - - -    EKG: NSR, no signs of ischemia    Assessment & Plan:     Routine Health Maintenance and Physical Exam  Exercise Activities and Dietary recommendations Goals   None     Immunization History  Administered Date(s) Administered  .  Influenza Inj Mdck Quad Pf 08/04/2018  . Influenza Split 08/04/2015  . Influenza,inj,Quad PF,6+ Mos 11/09/2017, 06/16/2019  . Pneumococcal Polysaccharide-23 05/17/2018  . Tdap 11/09/2017    Health Maintenance  Topic Date Due  . HIV Screening  05/22/1981  . URINE MICROALBUMIN  05/18/2019  . OPHTHALMOLOGY EXAM  09/29/2019  . HEMOGLOBIN A1C  12/14/2019  . FOOT EXAM  06/15/2020  . COLONOSCOPY  01/06/2021  . TETANUS/TDAP  11/10/2027  . INFLUENZA VACCINE  Completed  . PNEUMOCOCCAL POLYSACCHARIDE VACCINE AGE 1-64 HIGH RISK  Completed     Discussed health benefits of physical activity, and encouraged him to engage in regular exercise appropriate for his age and condition.    --------------------------------------------------------------------  Problem List Items Addressed This Visit      Cardiovascular and Mediastinum   Hypertension associated with diabetes ([place])    Well controlled Not on any medications-continue lifestyle interventions Recheck metabolic panel F/u in 6 months       Relevant Orders   CMP (Comprehensive metabolic panel)     Endocrine   Hyperlipidemia associated with type 2 diabetes mellitus ([place])    Reviewed last lipid panel and LDL is almost to goal Goal LDL less than 70 in the setting of diabetes Continue atorvastatin 80 mg daily Recheck FLP and CMP      Relevant Orders   CMP (Comprehensive metabolic panel)   Lipid panel   T2DM (type 2 diabetes mellitus) ([place])    Previously well controlled Associated with hypertension hyperlipidemia as well as morbid obesity Continue metformin at current dose Up-to-date on foot exam and vaccinations Urine  microalbumin due but unable to give urine sample - will try again at next visit Advised that he is due for an eye exam On statin Discussed diet and exercise Recheck A1c Follow-up in 6 months      Relevant Orders   Hemoglobin A1c     Other   Morbid obesity ([place])    Discussed importance of healthy weight  management Discussed diet and exercise       Tobacco use disorder    Again discussed the importance of cessation and the health risks of continued smoking Patient remains precontemplative about quitting We will continue to reassess      Chest pain    Suspect non-cardiac in origin given history, as pain is sharp, brief, nonexertional, bilateral, and has no associated symptoms He does have significant risk factors for heart disease, however (T2DM, HLD, and morbid obesity) EKG reassuring If recurs or develops red flag symptoms, will have him evaluated by cardiology Discussed worrisome chest pain symptoms and advised on ER precautions      Relevant Orders   EKG 12-Lead (Completed)    Other Visit Diagnoses    Encounter for annual physical exam    -  Primary   Relevant Orders   CMP (Comprehensive metabolic panel)   Lipid panel   Hemoglobin A1c       Return in about 6 months (around 03/28/2020) for chronic disease f/u.   The entirety of the information documented in the History of Present Illness, Review of Systems and Physical Exam were personally obtained by me. Portions of this information were initially documented by Lynford Humphrey, CMA and reviewed by me for thoroughness and accuracy.    , Dionne Bucy, MD MPH [facility]

## 2019-09-29 ENCOUNTER — Other Ambulatory Visit: Payer: Self-pay | Admitting: Family Medicine

## 2019-09-29 ENCOUNTER — Telehealth (INDEPENDENT_AMBULATORY_CARE_PROVIDER_SITE_OTHER): Payer: 59

## 2019-09-29 DIAGNOSIS — E1169 Type 2 diabetes mellitus with other specified complication: Secondary | ICD-10-CM | POA: Diagnosis not present

## 2019-09-29 LAB — COMPREHENSIVE METABOLIC PANEL
ALT: 37 IU/L (ref 0–44)
AST: 22 IU/L (ref 0–40)
Albumin/Globulin Ratio: 1.5 (ref 1.2–2.2)
Albumin: 4 g/dL (ref 3.8–4.9)
Alkaline Phosphatase: 95 IU/L (ref 39–117)
BUN/Creatinine Ratio: 17 (ref 9–20)
BUN: 12 mg/dL (ref 6–24)
Bilirubin Total: 0.4 mg/dL (ref 0.0–1.2)
CO2: 19 mmol/L — ABNORMAL LOW (ref 20–29)
Calcium: 9.1 mg/dL (ref 8.7–10.2)
Chloride: 106 mmol/L (ref 96–106)
Creatinine, Ser: 0.71 mg/dL — ABNORMAL LOW (ref 0.76–1.27)
GFR calc Af Amer: 124 mL/min/{1.73_m2} (ref >59)
GFR calc non Af Amer: 107 mL/min/{1.73_m2} (ref >59)
Globulin, Total: 2.7 g/dL (ref 1.5–4.5)
Glucose: 105 mg/dL — ABNORMAL HIGH (ref 65–99)
Potassium: 4.1 mmol/L (ref 3.5–5.2)
Sodium: 141 mmol/L (ref 134–144)
Total Protein: 6.7 g/dL (ref 6.0–8.5)

## 2019-09-29 LAB — LIPID PANEL
Chol/HDL Ratio: 3.3 ratio (ref 0.0–5.0)
Cholesterol, Total: 130 mg/dL (ref 100–199)
HDL: 40 mg/dL (ref >39)
LDL Chol Calc (NIH): 76 mg/dL (ref 0–99)
Triglycerides: 68 mg/dL (ref 0–149)
VLDL Cholesterol Cal: 14 mg/dL (ref 5–40)

## 2019-09-29 LAB — HEMOGLOBIN A1C
Est. average glucose Bld gHb Est-mCnc: 148 mg/dL
Hgb A1c MFr Bld: 6.8 % — ABNORMAL HIGH (ref 4.8–5.6)

## 2019-09-29 NOTE — Telephone Encounter (Signed)
LMTCB 09/29/2019  Thanks,   -Mickel Baas

## 2019-09-29 NOTE — Telephone Encounter (Signed)
-----   Message from Virginia Crews, MD sent at 09/29/2019  8:18 AM EST ----- Normal/stable labs. Cholesterol is almost to goal for diabetes.  Is he taking Atorvastatin regularly?  Important to work on decreasing cholesterol (high fat foods) in the diet

## 2019-10-03 ENCOUNTER — Telehealth: Payer: Self-pay

## 2019-10-03 LAB — POCT UA - MICROALBUMIN: Microalbumin Ur, POC: 20 mg/L

## 2019-10-03 NOTE — Telephone Encounter (Signed)
Patient advised as below. Patient reports he is taking Atorvastatin daily. Patient brought his urine sample for mirocalbumin.

## 2019-10-03 NOTE — Telephone Encounter (Signed)
-----   Message from Virginia Crews, MD sent at 10/03/2019 10:52 AM EST ----- Normal labs

## 2019-10-03 NOTE — Telephone Encounter (Signed)
OK. See result note. Continue atorvastatin.  Will recheck again at next visit

## 2019-10-03 NOTE — Telephone Encounter (Signed)
Patient advised as below.  

## 2019-11-29 ENCOUNTER — Other Ambulatory Visit: Payer: Self-pay | Admitting: Family Medicine

## 2019-12-19 ENCOUNTER — Other Ambulatory Visit: Payer: Self-pay | Admitting: Family Medicine

## 2019-12-19 NOTE — Telephone Encounter (Signed)
Requested Prescriptions  Pending Prescriptions Disp Refills  . atorvastatin (LIPITOR) 40 MG tablet [Pharmacy Med Name: ATORVASTATIN 40 MG TABLET] 90 tablet 3    Sig: TAKE 1 TABLET BY MOUTH EVERY DAY     Cardiovascular:  Antilipid - Statins Passed - 12/19/2019  1:49 AM      Passed - Total Cholesterol in normal range and within 360 days    Cholesterol, Total  Date Value Ref Range Status  09/28/2019 130 100 - 199 mg/dL Final         Passed - LDL in normal range and within 360 days    LDL Chol Calc (NIH)  Date Value Ref Range Status  09/28/2019 76 0 - 99 mg/dL Final         Passed - HDL in normal range and within 360 days    HDL  Date Value Ref Range Status  09/28/2019 40 >39 mg/dL Final         Passed - Triglycerides in normal range and within 360 days    Triglycerides  Date Value Ref Range Status  09/28/2019 68 0 - 149 mg/dL Final         Passed - Patient is not pregnant      Passed - Valid encounter within last 12 months    Recent Outpatient Visits          2 months ago Encounter for annual physical exam   [facility], Dionne Bucy, MD   6 months ago Need for influenza vaccination   [facility], Dionne Bucy, MD   9 months ago Hyperlipidemia associated with type 2 diabetes mellitus [facility])   [facility], Dionne Bucy, MD   1 year ago Type 2 diabetes mellitus with hyperglycemia, without long-term current use of insulin [facility])   [place], Dionne Bucy, MD   1 year ago Elevated BP without diagnosis of hypertension   [facility] [place], Dionne Bucy, MD      Future Appointments            In 3 months [place], Dionne Bucy, MD [facility], [place]

## 2020-01-25 ENCOUNTER — Other Ambulatory Visit: Payer: Self-pay | Admitting: Family Medicine

## 2020-03-29 ENCOUNTER — Ambulatory Visit: Payer: 59 | Admitting: Family Medicine

## 2020-03-29 NOTE — Progress Notes (Signed)
I,Laura E Walsh,acting as a scribe for Lavon Paganini, MD.,have documented all relevant documentation on the behalf of Lavon Paganini, MD,as directed by  Lavon Paganini, MD while in the presence of Lavon Paganini, MD.  Established patient visit   Patient: Dillon Bolton   DOB: 11-Oct-1966   55 y.o. Male  MRN: [id number] Visit Date: 04/01/2020  Today's healthcare provider: Lavon Paganini, MD   Chief Complaint  Patient presents with  . Diabetes  . Hyperlipidemia   Subjective    HPI Diabetes Mellitus Type II, follow-up  Lab Results  Component Value Date   HGBA1C 7.2 (A) 04/01/2020   HGBA1C 6.8 (H) 09/28/2019   HGBA1C 6.9 (A) 06/16/2019   Last seen for diabetes 6 months ago.  Management since then includes continuing the same treatment. He reports excellent compliance with treatment. He is not having side effects.   Home blood sugar records: Blood sugar is not being checked at home.  Episodes of hypoglycemia? No    Current insulin regiment: None Most Recent Eye Exam: Pt is due for an eye exam   Lipid/Cholesterol, follow-up  Last Lipid Panel: Lab Results  Component Value Date   CHOL 130 09/28/2019   LDLCALC 76 09/28/2019   HDL 40 09/28/2019   TRIG 68 09/28/2019    He was last seen for this 6 months ago.  Management since that visit includes no changes.  He reports excellent compliance with treatment. He is not having side effects.   Symptoms: No appetite changes No foot ulcerations  No chest pain No chest pressure/discomfort  No dyspnea No orthopnea  No fatigue No lower extremity edema  No palpitations No paroxysmal nocturnal dyspnea  No nausea No numbness or tingling of extremity  No polydipsia No polyuria  No speech difficulty No syncope   He is following a Regular diet.   Last metabolic panel Lab Results  Component Value Date   GLUCOSE 105 (H) 09/28/2019   NA 141 09/28/2019   K 4.1 09/28/2019   BUN 12 09/28/2019    CREATININE 0.71 (L) 09/28/2019   GFRNONAA 107 09/28/2019   GFRAA 124 09/28/2019   CALCIUM 9.1 09/28/2019   AST 22 09/28/2019   ALT 37 09/28/2019   The 10-year ASCVD risk score Mikey Bussing DC Jr., et al., 2013) is: 10.6%  ---------------------------------------------------------------------------------------------------   Patient Active Problem List   Diagnosis Date Noted  . Chest pain 09/28/2019  . Hypertension associated with diabetes ([place]) 06/16/2019  . T2DM (type 2 diabetes mellitus) ([place]) 05/18/2018  . Hyperlipidemia associated with type 2 diabetes mellitus ([place]) 05/17/2018  . Morbid obesity ([place]) 11/09/2017  . Tobacco use disorder 11/09/2017  . Psoriasis 11/09/2017  . Sleep apnea    Past Medical History:  Diagnosis Date  . Sleep apnea    Social History   Tobacco Use  . Smoking status: Current Every Day Smoker    Packs/day: 0.50    Types: Cigarettes    Start date: 24  . Smokeless tobacco: Never Used  . Tobacco comment: started smoking at age 86. Is smoking 0.25-0.75 PPD, which is decreased from 1.5-2 PPD  Vaping Use  . Vaping Use: Never used  Substance Use Topics  . Alcohol use: Yes    Alcohol/week: 1.0 - 2.0 standard drink    Types: 1 - 2 Cans of beer per week    Comment: occasional  . Drug use: No   No Known Allergies   Medications: Outpatient Medications Prior to Visit  Medication Sig  .  atorvastatin (LIPITOR) 40 MG tablet TAKE 1 TABLET BY MOUTH EVERY DAY  . metFORMIN (GLUCOPHAGE) 1000 MG tablet TAKE 1 TABLET (1,000 MG TOTAL) BY MOUTH 2 (TWO) TIMES DAILY WITH A MEAL.   No facility-administered medications prior to visit.    Review of Systems  Constitutional: Negative.   Respiratory: Negative.   Cardiovascular: Negative.   Gastrointestinal: Negative.   Neurological: Negative for dizziness, light-headedness and headaches.      Objective    BP 116/70 (BP Location: Left Arm, Patient Position: Sitting, Cuff Size: Large)   Pulse 90   Temp (!) 97.1 F  (36.2 C) (Temporal)   Ht 6\' 1"  (1.854 m)   Wt (!) 338 lb (153.3 kg)   SpO2 95%   BMI 44.59 kg/m    Physical Exam Vitals and nursing note reviewed.  Constitutional:      Appearance: Normal appearance.  Eyes:     Conjunctiva/sclera: Conjunctivae normal.  Cardiovascular:     Rate and Rhythm: Normal rate and regular rhythm.  Pulmonary:     Effort: Pulmonary effort is normal.     Breath sounds: Normal breath sounds.  Abdominal:     Tenderness: There is no abdominal tenderness.  Skin:    General: Skin is warm and dry.  Neurological:     Mental Status: He is alert and oriented to person, place, and time. Mental status is at baseline.  Psychiatric:        Mood and Affect: Mood normal.        Behavior: Behavior normal.        Thought Content: Thought content normal.        Judgment: Judgment normal.       Results for orders placed or performed in visit on 04/01/20  POCT glycosylated hemoglobin (Hb A1C)  Result Value Ref Range   Hemoglobin A1C 7.2 (A) 4.0 - 5.6 %    Assessment & Plan     Problem List Items Addressed This Visit      Cardiovascular and Mediastinum   Hypertension associated with diabetes ([place]) - Primary    Well controlled with lifestyle interventions.  Recheck metabolic panel F/u in 6 months       Relevant Medications   empagliflozin (JARDIANCE) 10 MG TABS tablet     Endocrine   Hyperlipidemia associated with type 2 diabetes mellitus ([place])    LDL slightly elevated at last check.  Continue statin Repeat FLP and CMP Goal LDL < 70       Relevant Medications   empagliflozin (JARDIANCE) 10 MG TABS tablet   Other Relevant Orders   Comprehensive metabolic panel   Lipid panel   T2DM (type 2 diabetes mellitus) ([facility])    A1C elevated at 7.2 - poorly controlled Associated with hypertension, hyperlipidemia and morbid obesity. Will add Jardiance 10mg  a day. Advised on possible side effects including UTI's. Advised pt he is due for an eye exam UTD on  foot exam and screenings On statin.  Recheck in three months.        Relevant Medications   empagliflozin (JARDIANCE) 10 MG TABS tablet   Other Relevant Orders   POCT glycosylated hemoglobin (Hb A1C) (Completed)   Comprehensive metabolic panel     Other   Morbid obesity ([place])    Discussed importance of healthy weight management Discussed diet and exercise       Relevant Medications   empagliflozin (JARDIANCE) 10 MG TABS tablet    Other Visit Diagnoses    Need for hepatitis  C screening test       Relevant Orders   Hepatitis C Antibody   Encounter for screening for HIV       Relevant Orders   HIV antibody (with reflex)       Return in about 3 months (around 07/02/2020) for chronic disease f/u.      I, Lavon Paganini, MD, have reviewed all documentation for this visit. The documentation on 04/02/20 for the exam, diagnosis, procedures, and orders are all accurate and complete.   Nahlia Hellmann, Dionne Bucy, MD, MPH [facility]

## 2020-04-01 ENCOUNTER — Encounter: Payer: Self-pay | Admitting: Family Medicine

## 2020-04-01 ENCOUNTER — Ambulatory Visit (INDEPENDENT_AMBULATORY_CARE_PROVIDER_SITE_OTHER): Payer: 59 | Admitting: Family Medicine

## 2020-04-01 ENCOUNTER — Other Ambulatory Visit: Payer: Self-pay

## 2020-04-01 VITALS — BP 116/70 | HR 90 | Temp 97.1°F | Ht 73.0 in | Wt 338.0 lb

## 2020-04-01 DIAGNOSIS — I152 Hypertension secondary to endocrine disorders: Secondary | ICD-10-CM

## 2020-04-01 DIAGNOSIS — Z1159 Encounter for screening for other viral diseases: Secondary | ICD-10-CM

## 2020-04-01 DIAGNOSIS — E785 Hyperlipidemia, unspecified: Secondary | ICD-10-CM

## 2020-04-01 DIAGNOSIS — E1159 Type 2 diabetes mellitus with other circulatory complications: Secondary | ICD-10-CM

## 2020-04-01 DIAGNOSIS — E1169 Type 2 diabetes mellitus with other specified complication: Secondary | ICD-10-CM | POA: Diagnosis not present

## 2020-04-01 DIAGNOSIS — Z114 Encounter for screening for human immunodeficiency virus [HIV]: Secondary | ICD-10-CM | POA: Diagnosis not present

## 2020-04-01 DIAGNOSIS — I10 Essential (primary) hypertension: Secondary | ICD-10-CM

## 2020-04-01 LAB — POCT GLYCOSYLATED HEMOGLOBIN (HGB A1C): Hemoglobin A1C: 7.2 % — AB (ref 4.0–5.6)

## 2020-04-01 MED ORDER — EMPAGLIFLOZIN 10 MG PO TABS: 10.0000 mg | ORAL_TABLET | Freq: Every day | ORAL | 3 refills | Status: DC

## 2020-04-01 NOTE — Assessment & Plan Note (Addendum)
A1C elevated at 7.2 - poorly controlled Associated with hypertension, hyperlipidemia and morbid obesity. Will add Jardiance 10mg  a day. Advised on possible side effects including UTI's. Advised pt he is due for an eye exam UTD on foot exam and screenings On statin.  Recheck in three months.

## 2020-04-01 NOTE — Assessment & Plan Note (Signed)
Well controlled with lifestyle interventions.  Recheck metabolic panel F/u in 6 months

## 2020-04-01 NOTE — Assessment & Plan Note (Signed)
LDL slightly elevated at last check.  Continue statin Repeat FLP and CMP Goal LDL < 70

## 2020-04-01 NOTE — Patient Instructions (Signed)

## 2020-04-01 NOTE — Assessment & Plan Note (Signed)
Discussed importance of healthy weight management Discussed diet and exercise  

## 2020-04-18 ENCOUNTER — Other Ambulatory Visit: Payer: Self-pay | Admitting: Family Medicine

## 2020-07-18 ENCOUNTER — Other Ambulatory Visit: Payer: Self-pay

## 2020-07-18 ENCOUNTER — Encounter: Payer: Self-pay | Admitting: *Deleted

## 2020-07-18 DIAGNOSIS — I700 Atherosclerosis of aorta: Secondary | ICD-10-CM | POA: Diagnosis not present

## 2020-07-18 DIAGNOSIS — Z20822 Contact with and (suspected) exposure to covid-19: Secondary | ICD-10-CM | POA: Insufficient documentation

## 2020-07-18 DIAGNOSIS — Z7984 Long term (current) use of oral hypoglycemic drugs: Secondary | ICD-10-CM | POA: Insufficient documentation

## 2020-07-18 DIAGNOSIS — F17210 Nicotine dependence, cigarettes, uncomplicated: Secondary | ICD-10-CM | POA: Insufficient documentation

## 2020-07-18 DIAGNOSIS — K760 Fatty (change of) liver, not elsewhere classified: Secondary | ICD-10-CM | POA: Diagnosis not present

## 2020-07-18 DIAGNOSIS — N132 Hydronephrosis with renal and ureteral calculous obstruction: Secondary | ICD-10-CM | POA: Insufficient documentation

## 2020-07-18 DIAGNOSIS — E119 Type 2 diabetes mellitus without complications: Secondary | ICD-10-CM | POA: Insufficient documentation

## 2020-07-18 DIAGNOSIS — Z79899 Other long term (current) drug therapy: Secondary | ICD-10-CM | POA: Diagnosis not present

## 2020-07-18 DIAGNOSIS — I10 Essential (primary) hypertension: Secondary | ICD-10-CM | POA: Insufficient documentation

## 2020-07-18 DIAGNOSIS — G4730 Sleep apnea, unspecified: Secondary | ICD-10-CM | POA: Diagnosis not present

## 2020-07-18 LAB — URINALYSIS, COMPLETE (UACMP) WITH MICROSCOPIC
Bilirubin Urine: NEGATIVE
Glucose, UA: >=500 mg/dL — AB
Ketones, ur: 5 mg/dL — AB
Nitrite: NEGATIVE
Protein, ur: NEGATIVE mg/dL
Specific Gravity, Urine: 1.018 (ref 1.005–1.030)
WBC, UA: >50 WBC/hpf — ABNORMAL HIGH (ref 0–5)
pH: 5 (ref 5.0–8.0)

## 2020-07-18 NOTE — ED Triage Notes (Signed)
Pt has left flank pain with n/v.  Sx began today  Vomiting x 3   No hx kidney stones.  Pt alert  Speech clear.

## 2020-07-19 ENCOUNTER — Ambulatory Visit
Admission: RE | Admit: 2020-07-19 | Discharge: 2020-07-19 | Disposition: A | Discharge status: Home / Self Care | Payer: 59 | Attending: Urology | Admitting: Urology

## 2020-07-19 ENCOUNTER — Ambulatory Visit: Payer: 59 | Admitting: Certified Registered Nurse Anesthetist

## 2020-07-19 ENCOUNTER — Encounter: Payer: Self-pay | Admitting: Urology

## 2020-07-19 ENCOUNTER — Ambulatory Visit: Payer: 59

## 2020-07-19 ENCOUNTER — Other Ambulatory Visit: Payer: Self-pay | Admitting: Radiology

## 2020-07-19 ENCOUNTER — Other Ambulatory Visit
Admission: RE | Admit: 2020-07-19 | Discharge: 2020-07-19 | Disposition: A | Discharge status: Home / Self Care | Payer: 59 | Source: Ambulatory Visit | Attending: Urology | Admitting: Urology

## 2020-07-19 ENCOUNTER — Emergency Department
Admission: EM | Admit: 2020-07-19 | Discharge: 2020-07-19 | Disposition: A | Discharge status: Home / Self Care | Payer: 59 | Source: Home / Self Care | Attending: Emergency Medicine | Admitting: Emergency Medicine

## 2020-07-19 ENCOUNTER — Emergency Department: Payer: 59

## 2020-07-19 ENCOUNTER — Encounter
Admission: RE | Disposition: A | Discharge status: Home / Self Care | Payer: Self-pay | Source: Home / Self Care | Attending: Urology

## 2020-07-19 ENCOUNTER — Other Ambulatory Visit: Payer: Self-pay

## 2020-07-19 ENCOUNTER — Ambulatory Visit (INDEPENDENT_AMBULATORY_CARE_PROVIDER_SITE_OTHER): Payer: 59 | Admitting: Urology

## 2020-07-19 VITALS — BP 138/82 | HR 102 | Ht 73.0 in | Wt 317.0 lb

## 2020-07-19 DIAGNOSIS — Z7984 Long term (current) use of oral hypoglycemic drugs: Secondary | ICD-10-CM | POA: Insufficient documentation

## 2020-07-19 DIAGNOSIS — N200 Calculus of kidney: Secondary | ICD-10-CM

## 2020-07-19 DIAGNOSIS — Z419 Encounter for procedure for purposes other than remedying health state, unspecified: Secondary | ICD-10-CM

## 2020-07-19 DIAGNOSIS — I700 Atherosclerosis of aorta: Secondary | ICD-10-CM | POA: Insufficient documentation

## 2020-07-19 DIAGNOSIS — Z20822 Contact with and (suspected) exposure to covid-19: Secondary | ICD-10-CM | POA: Insufficient documentation

## 2020-07-19 DIAGNOSIS — N23 Unspecified renal colic: Secondary | ICD-10-CM

## 2020-07-19 DIAGNOSIS — N132 Hydronephrosis with renal and ureteral calculous obstruction: Secondary | ICD-10-CM

## 2020-07-19 DIAGNOSIS — K760 Fatty (change of) liver, not elsewhere classified: Secondary | ICD-10-CM | POA: Insufficient documentation

## 2020-07-19 DIAGNOSIS — R8281 Pyuria: Secondary | ICD-10-CM | POA: Diagnosis not present

## 2020-07-19 DIAGNOSIS — Z79899 Other long term (current) drug therapy: Secondary | ICD-10-CM | POA: Insufficient documentation

## 2020-07-19 DIAGNOSIS — G4730 Sleep apnea, unspecified: Secondary | ICD-10-CM | POA: Insufficient documentation

## 2020-07-19 DIAGNOSIS — I10 Essential (primary) hypertension: Secondary | ICD-10-CM | POA: Insufficient documentation

## 2020-07-19 DIAGNOSIS — F17210 Nicotine dependence, cigarettes, uncomplicated: Secondary | ICD-10-CM | POA: Insufficient documentation

## 2020-07-19 DIAGNOSIS — E119 Type 2 diabetes mellitus without complications: Secondary | ICD-10-CM | POA: Insufficient documentation

## 2020-07-19 HISTORY — DX: Type 2 diabetes mellitus without complications: E11.9

## 2020-07-19 HISTORY — PX: CYSTOSCOPY WITH STENT PLACEMENT: SHX5790

## 2020-07-19 LAB — GLUCOSE, CAPILLARY
Glucose-Capillary: 136 mg/dL — ABNORMAL HIGH (ref 70–99)
Glucose-Capillary: 120 mg/dL — ABNORMAL HIGH (ref 70–99)

## 2020-07-19 LAB — COMPREHENSIVE METABOLIC PANEL
ALT: 28 U/L (ref 0–44)
AST: 18 U/L (ref 15–41)
Albumin: 4.2 g/dL (ref 3.5–5.0)
Alkaline Phosphatase: 65 U/L (ref 38–126)
Anion gap: 11 (ref 5–15)
BUN: 14 mg/dL (ref 6–20)
CO2: 23 mmol/L (ref 22–32)
Calcium: 9 mg/dL (ref 8.9–10.3)
Chloride: 104 mmol/L (ref 98–111)
Creatinine, Ser: 0.91 mg/dL (ref 0.61–1.24)
GFR calc non Af Amer: >60 mL/min (ref >60)
Glucose, Bld: 138 mg/dL — ABNORMAL HIGH (ref 70–99)
Potassium: 3.9 mmol/L (ref 3.5–5.1)
Sodium: 138 mmol/L (ref 135–145)
Total Bilirubin: 0.9 mg/dL (ref 0.3–1.2)
Total Protein: 7.5 g/dL (ref 6.5–8.1)

## 2020-07-19 LAB — URINALYSIS, COMPLETE
Bilirubin, UA: NEGATIVE
Nitrite, UA: NEGATIVE
Specific Gravity, UA: 1.025 (ref 1.005–1.030)
Urobilinogen, Ur: 0.2 mg/dL (ref 0.2–1.0)
pH, UA: 6 (ref 5.0–7.5)

## 2020-07-19 LAB — CBC
HCT: 46.9 % (ref 39.0–52.0)
Hemoglobin: 15.7 g/dL (ref 13.0–17.0)
MCH: 30.7 pg (ref 26.0–34.0)
MCHC: 33.5 g/dL (ref 30.0–36.0)
MCV: 91.8 fL (ref 80.0–100.0)
Platelets: 197 10*3/uL (ref 150–400)
RBC: 5.11 MIL/uL (ref 4.22–5.81)
RDW: 14.2 % (ref 11.5–15.5)
WBC: 17.7 10*3/uL — ABNORMAL HIGH (ref 4.0–10.5)
nRBC: 0 % (ref 0.0–0.2)

## 2020-07-19 LAB — SARS CORONAVIRUS 2 BY RT PCR (HOSPITAL ORDER, PERFORMED IN ~~LOC~~ HOSPITAL LAB): SARS Coronavirus 2: NEGATIVE

## 2020-07-19 LAB — MICROSCOPIC EXAMINATION: WBC, UA: >30 /hpf — AB (ref 0–5)

## 2020-07-19 SURGERY — CYSTOSCOPY, WITH STENT INSERTION
Anesthesia: General | Laterality: Left

## 2020-07-19 MED ORDER — FAMOTIDINE 20 MG PO TABS
ORAL_TABLET | ORAL | Status: AC
  Administered 2020-07-19: 20 mg via ORAL
  Filled 2020-07-19: qty 1

## 2020-07-19 MED ORDER — PROPOFOL 10 MG/ML IV BOLUS
INTRAVENOUS | Status: AC
  Filled 2020-07-19: qty 20

## 2020-07-19 MED ORDER — SUGAMMADEX SODIUM 500 MG/5ML IV SOLN
INTRAVENOUS | Status: AC
  Filled 2020-07-19: qty 5

## 2020-07-19 MED ORDER — DEXAMETHASONE SODIUM PHOSPHATE 10 MG/ML IJ SOLN
INTRAMUSCULAR | Status: AC
  Filled 2020-07-19: qty 1

## 2020-07-19 MED ORDER — OXYBUTYNIN CHLORIDE 5 MG PO TABS: ORAL_TABLET | ORAL | 0 refills | Status: DC

## 2020-07-19 MED ORDER — CEFUROXIME AXETIL 500 MG PO TABS: 500.0000 mg | ORAL_TABLET | Freq: Two times a day (BID) | ORAL | 0 refills | Status: DC

## 2020-07-19 MED ORDER — ROCURONIUM BROMIDE 100 MG/10ML IV SOLN
INTRAVENOUS | Status: DC | PRN
  Administered 2020-07-19: 30 mg via INTRAVENOUS
  Administered 2020-07-19: 10 mg via INTRAVENOUS

## 2020-07-19 MED ORDER — SODIUM CHLORIDE 0.9 % IV SOLN
1.0000 g | INTRAVENOUS | Status: AC
  Administered 2020-07-19: 1 g via INTRAVENOUS
  Filled 2020-07-19: qty 1

## 2020-07-19 MED ORDER — SUGAMMADEX SODIUM 200 MG/2ML IV SOLN
INTRAVENOUS | Status: DC | PRN
  Administered 2020-07-19: 500 mg via INTRAVENOUS

## 2020-07-19 MED ORDER — ONDANSETRON HCL 4 MG/2ML IJ SOLN
INTRAMUSCULAR | Status: DC | PRN
  Administered 2020-07-19: 4 mg via INTRAVENOUS

## 2020-07-19 MED ORDER — OXYCODONE-ACETAMINOPHEN 5-325 MG PO TABS: 1.0000 | ORAL_TABLET | ORAL | 0 refills | Status: DC | PRN

## 2020-07-19 MED ORDER — ONDANSETRON HCL 4 MG/2ML IJ SOLN
INTRAMUSCULAR | Status: AC
  Filled 2020-07-19: qty 2

## 2020-07-19 MED ORDER — SUCCINYLCHOLINE CHLORIDE 20 MG/ML IJ SOLN
INTRAMUSCULAR | Status: DC | PRN
  Administered 2020-07-19: 140 mg via INTRAVENOUS

## 2020-07-19 MED ORDER — ORAL CARE MOUTH RINSE: 15.0000 mL | Freq: Once | OROMUCOSAL | Status: AC

## 2020-07-19 MED ORDER — KETOROLAC TROMETHAMINE 30 MG/ML IJ SOLN
30.0000 mg | Freq: Once | INTRAMUSCULAR | Status: AC
  Administered 2020-07-19: 30 mg via INTRAVENOUS
  Filled 2020-07-19: qty 1

## 2020-07-19 MED ORDER — FENTANYL CITRATE (PF) 100 MCG/2ML IJ SOLN
INTRAMUSCULAR | Status: AC
  Filled 2020-07-19: qty 2

## 2020-07-19 MED ORDER — TAMSULOSIN HCL 0.4 MG PO CAPS: 0.4000 mg | ORAL_CAPSULE | Freq: Every day | ORAL | 0 refills | Status: DC

## 2020-07-19 MED ORDER — CHLORHEXIDINE GLUCONATE 0.12 % MT SOLN
OROMUCOSAL | Status: AC
  Filled 2020-07-19: qty 15

## 2020-07-19 MED ORDER — MIDAZOLAM HCL 2 MG/2ML IJ SOLN
INTRAMUSCULAR | Status: DC | PRN
  Administered 2020-07-19: 2 mg via INTRAVENOUS

## 2020-07-19 MED ORDER — FENTANYL CITRATE (PF) 250 MCG/5ML IJ SOLN
INTRAMUSCULAR | Status: DC | PRN
Start: 2020-07-19 — End: 2020-07-19
  Administered 2020-07-19: 100 ug via INTRAVENOUS

## 2020-07-19 MED ORDER — LIDOCAINE HCL (CARDIAC) PF 100 MG/5ML IV SOSY
PREFILLED_SYRINGE | INTRAVENOUS | Status: DC | PRN
  Administered 2020-07-19: 100 mg via INTRAVENOUS

## 2020-07-19 MED ORDER — CHLORHEXIDINE GLUCONATE 0.12 % MT SOLN
15.0000 mL | Freq: Once | OROMUCOSAL | Status: AC
  Administered 2020-07-19: 15 mL via OROMUCOSAL

## 2020-07-19 MED ORDER — FAMOTIDINE 20 MG PO TABS: 20.0000 mg | ORAL_TABLET | Freq: Once | ORAL | Status: AC

## 2020-07-19 MED ORDER — FENTANYL CITRATE (PF) 100 MCG/2ML IJ SOLN: 25.0000 ug | INTRAMUSCULAR | Status: DC | PRN

## 2020-07-19 MED ORDER — ONDANSETRON HCL 4 MG/2ML IJ SOLN: 4.0000 mg | Freq: Once | INTRAMUSCULAR | Status: DC | PRN

## 2020-07-19 MED ORDER — MIDAZOLAM HCL 2 MG/2ML IJ SOLN
INTRAMUSCULAR | Status: AC
  Filled 2020-07-19: qty 2

## 2020-07-19 MED ORDER — IOHEXOL 180 MG/ML  SOLN
INTRAMUSCULAR | Status: DC | PRN
  Administered 2020-07-19: 20 mL

## 2020-07-19 MED ORDER — SODIUM CHLORIDE 0.9 % IV SOLN: INTRAVENOUS | Status: DC

## 2020-07-19 MED ORDER — PROPOFOL 10 MG/ML IV BOLUS
INTRAVENOUS | Status: DC | PRN
  Administered 2020-07-19: 200 mg via INTRAVENOUS

## 2020-07-19 MED ORDER — DEXAMETHASONE SODIUM PHOSPHATE 10 MG/ML IJ SOLN
INTRAMUSCULAR | Status: DC | PRN
  Administered 2020-07-19: 10 mg via INTRAVENOUS

## 2020-07-19 MED ORDER — PHENYLEPHRINE HCL (PRESSORS) 10 MG/ML IV SOLN
INTRAVENOUS | Status: DC | PRN
  Administered 2020-07-19 (×2): 150 ug via INTRAVENOUS

## 2020-07-19 MED ORDER — LIDOCAINE HCL (PF) 2 % IJ SOLN
INTRAMUSCULAR | Status: AC
  Filled 2020-07-19: qty 5

## 2020-07-19 MED ORDER — SUCCINYLCHOLINE CHLORIDE 200 MG/10ML IV SOSY
PREFILLED_SYRINGE | INTRAVENOUS | Status: AC
  Filled 2020-07-19: qty 10

## 2020-07-19 MED ORDER — ROCURONIUM BROMIDE 10 MG/ML (PF) SYRINGE
PREFILLED_SYRINGE | INTRAVENOUS | Status: AC
  Filled 2020-07-19: qty 10

## 2020-07-19 SURGICAL SUPPLY — 18 items
BAG DRAIN CYSTO-URO LG1000N (MISCELLANEOUS) ×2 IMPLANT
BRUSH SCRUB EZ 1% IODOPHOR (MISCELLANEOUS) ×2 IMPLANT
CATH URETL 5X70 OPEN END (CATHETERS) IMPLANT
GLOVE BIOGEL PI IND STRL 7.5 (GLOVE) ×1 IMPLANT
GLOVE BIOGEL PI INDICATOR 7.5 (GLOVE) ×1
GOWN STRL REUS W/ TWL XL LVL3 (GOWN DISPOSABLE) ×1 IMPLANT
GOWN STRL REUS W/TWL XL LVL3 (GOWN DISPOSABLE) ×2
GUIDEWIRE STR DUAL SENSOR (WIRE) ×2 IMPLANT
KIT TURNOVER CYSTO (KITS) ×2 IMPLANT
PACK CYSTO AR (MISCELLANEOUS) ×2 IMPLANT
SET CYSTO W/LG BORE CLAMP LF (SET/KITS/TRAYS/PACK) ×2 IMPLANT
SOL .9 NS 3000ML IRR  AL (IV SOLUTION) ×1
SOL .9 NS 3000ML IRR AL (IV SOLUTION) ×1
SOL .9 NS 3000ML IRR UROMATIC (IV SOLUTION) ×1 IMPLANT
STENT URET 6FRX24 CONTOUR (STENTS) IMPLANT
STENT URET 6FRX26 CONTOUR (STENTS) ×2 IMPLANT
SURGILUBE 2OZ TUBE FLIPTOP (MISCELLANEOUS) ×2 IMPLANT
WATER STERILE IRR 1000ML POUR (IV SOLUTION) ×2 IMPLANT

## 2020-07-19 NOTE — Anesthesia Procedure Notes (Signed)
Procedure Name: Intubation Date/Time: 07/19/2020 3:31 PM Performed by: Eben Burow, CRNA Pre-anesthesia Checklist: Patient identified, Emergency Drugs available, Suction available and Patient being monitored Patient Re-evaluated:Patient Re-evaluated prior to induction Oxygen Delivery Method: Circle system utilized Preoxygenation: Pre-oxygenation with 100% oxygen Induction Type: IV induction Ventilation: Oral airway inserted - appropriate to patient size, Mask ventilation with difficulty and Two handed mask ventilation required Laryngoscope Size: McGraph and 4 Grade View: Grade I Tube type: Oral Tube size: 8.0 mm Number of attempts: 1 Airway Equipment and Method: Stylet,  Oral airway and Video-laryngoscopy Placement Confirmation: ETT inserted through vocal cords under direct vision,  positive ETCO2 and breath sounds checked- equal and bilateral Secured at: 24 cm Tube secured with: Tape Dental Injury: Teeth and Oropharynx as per pre-operative assessment

## 2020-07-19 NOTE — ED Provider Notes (Signed)
[facility] Emergency Department Provider Note  ____________________________________________   First MD Initiated Contact with Patient 07/19/20 0134     (approximate)  I have reviewed the triage vital signs and the nursing notes.   HISTORY  Chief Complaint Flank Pain    HPI Dillon Bolton is a 54 y.o. male with below list of previous medical conditions including type 2 diabetes presents to the emergency department secondary to 1 day history of left flank pain with associated nausea and vomiting.  Patient denies any diarrhea no constipation.  Patient denies any dysuria or hematuria.  Patient states that current pain score is 4 out of 10 which he describes as an aching pain.  Patient denies any previous history of kidney stones.  Patient denies any fever afebrile on presentation.       Past Medical History:  Diagnosis Date  . Sleep apnea     Patient Active Problem List   Diagnosis Date Noted  . Chest pain 09/28/2019  . Hypertension associated with diabetes ([place]) 06/16/2019  . T2DM (type 2 diabetes mellitus) ([place]) 05/18/2018  . Hyperlipidemia associated with type 2 diabetes mellitus ([place]) 05/17/2018  . Morbid obesity ([place]) 11/09/2017  . Tobacco use disorder 11/09/2017  . Psoriasis 11/09/2017  . Sleep apnea     Past Surgical History:  Procedure Laterality Date  . COLONOSCOPY WITH PROPOFOL N/A 01/06/2018   Procedure: COLONOSCOPY WITH PROPOFOL;  Surgeon: Jonathon Bellows, MD;  Location: [facility] ENDOSCOPY;  Service: Gastroenterology;  Laterality: N/A;  . KNEE SURGERY Bilateral 1975   removed osteochondromas at age 55  . NOSE SURGERY     to help with OSA, opened up passages  . TONSILLECTOMY    . WISDOM TOOTH EXTRACTION      Prior to Admission medications   Medication Sig Start Date End Date Taking? Authorizing Provider  atorvastatin (LIPITOR) 40 MG tablet TAKE 1 TABLET BY MOUTH EVERY DAY 12/19/19   Bacigalupo, Dionne Bucy, MD  empagliflozin (JARDIANCE) 10 MG  TABS tablet Take 1 tablet (10 mg total) by mouth daily before breakfast. 04/01/20   Bacigalupo, Dionne Bucy, MD  metFORMIN (GLUCOPHAGE) 1000 MG tablet TAKE 1 TABLET (1,000 MG TOTAL) BY MOUTH 2 (TWO) TIMES DAILY WITH A MEAL. 04/18/20   Bacigalupo, Dionne Bucy, MD    Allergies Patient has no known allergies.  Family History  Problem Relation Age of Onset  . Hypertension Mother   . Colon cancer Father   . Hypertension Brother   . Prostate cancer Neg Hx     Social History Social History   Tobacco Use  . Smoking status: Current Every Day Smoker    Packs/day: 0.50    Types: Cigarettes    Start date: 20  . Smokeless tobacco: Never Used  . Tobacco comment: started smoking at age 48. Is smoking 0.25-0.75 PPD, which is decreased from 1.5-2 PPD  Vaping Use  . Vaping Use: Never used  Substance Use Topics  . Alcohol use: Yes    Alcohol/week: 1.0 - 2.0 standard drink    Types: 1 - 2 Cans of beer per week    Comment: occasional  . Drug use: No    Review of Systems Constitutional: No fever/chills Eyes: No visual changes. ENT: No sore throat. Cardiovascular: Denies chest pain. Respiratory: Denies shortness of breath. Gastrointestinal: Positive for left flank pain and nausea and vomiting.  No diarrhea.  No constipation. Genitourinary: Negative for dysuria. Musculoskeletal: Negative for neck pain.  Negative for back pain. Integumentary: Negative  for rash. Neurological: Negative for headaches, focal weakness or numbness.   ____________________________________________   PHYSICAL EXAM:  VITAL SIGNS: ED Triage Vitals  Enc Vitals Group     BP 07/19/20 0118 (!) 159/66     Pulse Rate 07/18/20 2201 79     Resp 07/18/20 2201 18     Temp 07/18/20 2201 97.8 F (36.6 C)     Temp Source 07/18/20 2201 Oral     SpO2 07/18/20 2201 99 %     Weight 07/18/20 2201 (!) 144.3 kg (318 lb 2 oz)     Height --      Head Circumference --      Peak Flow --      Pain Score 07/18/20 2216 4     Pain Loc  --      Pain Edu? --      Excl. in [place]? --     Constitutional: Alert and oriented.  Eyes: Conjunctivae are normal.  Head: Atraumatic. Mouth/Throat: Patient is wearing a mask. Neck: No stridor.  No meningeal signs.   Cardiovascular: Normal rate, regular rhythm. Good peripheral circulation. Grossly normal heart sounds. Respiratory: Normal respiratory effort.  No retractions. Gastrointestinal: Soft and nontender. No distention.  Musculoskeletal: No lower extremity tenderness nor edema. No gross deformities of extremities. Neurologic:  Normal speech and language. No gross focal neurologic deficits are appreciated.  Skin:  Skin is warm, dry and intact. Psychiatric: Mood and affect are normal. Speech and behavior are normal.  ____________________________________________   LABS (all labs ordered are listed, but only abnormal results are displayed)  Labs Reviewed  URINALYSIS, COMPLETE (UACMP) WITH MICROSCOPIC - Abnormal; Notable for the following components:      Result Value   Color, Urine YELLOW (*)    APPearance HAZY (*)    Glucose, UA >=500 (*)    Hgb urine dipstick MODERATE (*)    Ketones, ur 5 (*)    Leukocytes,Ua MODERATE (*)    WBC, UA >50 (*)    Bacteria, UA RARE (*)    All other components within normal limits  CBC - Abnormal; Notable for the following components:   WBC 17.7 (*)    All other components within normal limits  COMPREHENSIVE METABOLIC PANEL - Abnormal; Notable for the following components:   Glucose, Bld 138 (*)    All other components within normal limits   ____________________________________________  EKG  ED ECG REPORT I, [place] Sharyon Peitz, the attending physician, personally viewed and interpreted this ECG.   Date: 07/18/2020  EKG Time: 10:02 PM  Rate: 82  Rhythm: Normal sinus rhythm  Axis: Normal  Intervals: Normal  ST&T Change: None  ____________________________________________  RADIOLOGY I, [place] Dmarius Reeder, personally viewed and  evaluated these images (plain radiographs) as part of my medical decision making, as well as reviewing the written report by the radiologist.  ED MD interpretation: Mild left-sided hydronephrosis secondary to an obstructing 1.7 cm stone in the left renal pelvis  Official radiology report(s): CT Renal Stone Study  Result Date: 07/19/2020 CLINICAL DATA:  Flank pain. EXAM: CT ABDOMEN AND PELVIS WITHOUT CONTRAST TECHNIQUE: Multidetector CT imaging of the abdomen and pelvis was performed following the standard protocol without IV contrast. COMPARISON:  None. FINDINGS: Lower chest: The lung bases are clear. The heart size is normal. Hepatobiliary: There is decreased hepatic attenuation suggestive of hepatic steatosis. Normal gallbladder.There is no biliary ductal dilation. Pancreas: Normal contours without ductal dilatation. No peripancreatic fluid collection. Spleen: Unremarkable. Adrenals/Urinary Tract: --Adrenal glands: Unremarkable. --  Right kidney/ureter: There are multiple small nonobstructing stones in the right kidney measuring up to approximately 4 mm. No right-sided hydronephrosis. --Left kidney/ureter: There is mild left-sided hydronephrosis secondary to an obstructing 1.1 cm stone in the left renal pelvis. Additional nonobstructing punctate stones are noted in the lower pole the left kidney. There are no left ureteral stones. --Urinary bladder: Unremarkable. Stomach/Bowel: --Stomach/Duodenum: No hiatal hernia or other gastric abnormality. Normal duodenal course and caliber. --Small bowel: Unremarkable. --Colon: Unremarkable. --Appendix: Normal. Vascular/Lymphatic: Atherosclerotic calcification is present within the non-aneurysmal abdominal aorta, without hemodynamically significant stenosis. --there are few mildly enlarged retroperitoneal lymph nodes, likely reactive. --No mesenteric lymphadenopathy. --No pelvic or inguinal lymphadenopathy. Reproductive: Unremarkable Other: No ascites or free air. The  abdominal wall is normal. Musculoskeletal. No acute displaced fractures. IMPRESSION: 1. Mild left-sided hydronephrosis secondary to an obstructing 1.1 cm stone in the left renal pelvis. 2. Bilateral nonobstructing nephrolithiasis. 3. Hepatic steatosis. Aortic Atherosclerosis (ICD10-I70.0). Electronically Signed   By: Constance Holster M.D.   On: 07/19/2020 00:28    Procedures   ____________________________________________   INITIAL IMPRESSION / MDM / [place] / ED COURSE  As part of my medical decision making, I reviewed the following data within the electronic MEDICAL RECORD NUMBER   54 year old male presented with above-stated history and physical exam with differential including but not limited to ureterolithiasis.  CT scan revealed a 1.1 cm left renal pelvis obstructing stone with mild hydronephrosis.  Patient's laboratory data revealed a white blood cell count of 17.7 however patient is afebrile urinalysis not consistent with UTI.  She discussed with Dr. Caprice Beaver urologist on-call who will follow up with the patient on the outpatient setting today.  Dr. Westley Gambles recommended Toradol for pain relief which was administered.  On reevaluation patient states that pain resolved.  Spoke with the patient at length regarding necessity of following up with urologist today.  Patient advised to return to the emergency department if any worsening pain or fever were to ensue.  Patient prescribed Percocet for home  ____________________________________________  FINAL CLINICAL IMPRESSION(S) / ED DIAGNOSES  Final diagnoses:  Kidney stone on left side     MEDICATIONS GIVEN DURING THIS VISIT:  Medications  ketorolac (TORADOL) 30 MG/ML injection 30 mg (30 mg Intravenous Given 07/19/20 0207)     ED Discharge Orders    None      *Please note:  Dillon Bolton was evaluated in Emergency Department on 07/19/2020 for the symptoms described in the history of present illness. He was evaluated in the  context of the global COVID-19 pandemic, which necessitated consideration that the patient might be at risk for infection with the SARS-CoV-2 virus that causes COVID-19. Institutional protocols and algorithms that pertain to the evaluation of patients at risk for COVID-19 are in a state of rapid change based on information released by regulatory bodies including the CDC and federal and state organizations. These policies and algorithms were followed during the patient's care in the ED.  Some ED evaluations and interventions may be delayed as a result of limited staffing during and after the pandemic.*  Note:  This document was prepared using Dragon voice recognition software and may include unintentional dictation errors.   Gregor Hams, MD 07/19/20 [phone]

## 2020-07-19 NOTE — Anesthesia Preprocedure Evaluation (Signed)
Anesthesia Evaluation  Patient identified by MRN, date of birth, ID band Patient awake    Reviewed: Allergy & Precautions, NPO status , Patient's Chart, lab work & pertinent test results  History of Anesthesia Complications Negative for: history of anesthetic complications  Airway Mallampati: III  TM Distance: <3 FB Neck ROM: Full    Dental no notable dental hx.    Pulmonary sleep apnea and Continuous Positive Airway Pressure Ventilation , neg COPD, Current SmokerPatient did not abstain from smoking.,    breath sounds clear to auscultation- rhonchi (-) wheezing      Cardiovascular hypertension, Pt. on medications (-) CAD, (-) Past MI, (-) Cardiac Stents and (-) CABG  Rhythm:Regular Rate:Normal - Systolic murmurs and - Diastolic murmurs    Neuro/Psych neg Seizures negative neurological ROS  negative psych ROS   GI/Hepatic negative GI ROS, Neg liver ROS,   Endo/Other  diabetes, Oral Hypoglycemic Agents  Renal/GU negative Renal ROS     Musculoskeletal negative musculoskeletal ROS (+)   Abdominal (+) + obese,   Peds  Hematology negative hematology ROS (+)   Anesthesia Other Findings Past Medical History: No date: Diabetes mellitus without complication (HCC) No date: Sleep apnea   Reproductive/Obstetrics                             Anesthesia Physical Anesthesia Plan  ASA: III  Anesthesia Plan: General   Post-op Pain Management:    Induction: Intravenous  PONV Risk Score and Plan: 0 and Ondansetron  Airway Management Planned: Oral ETT  Additional Equipment:   Intra-op Plan:   Post-operative Plan: Extubation in OR  Informed Consent: I have reviewed the patients History and Physical, chart, labs and discussed the procedure including the risks, benefits and alternatives for the proposed anesthesia with the patient or authorized representative who has indicated his/her understanding  and acceptance.     Dental advisory given  Plan Discussed with: CRNA and Anesthesiologist  Anesthesia Plan Comments:         Anesthesia Quick Evaluation

## 2020-07-19 NOTE — Progress Notes (Signed)
07/19/2020 10:33 AM   Dillon Bolton 20-Apr-1966 [id number]  Referring provider: Virginia Bolton, [place],  [place] [address] Chief Complaint  Patient presents with  . Nephrolithiasis    HPI: Dillon Bolton is a 54 y.o. male who presents today for evaluation and management of kidney stone.  -seen in ED earlier this morning for left flank pain. -Associated nausea and vomiting.  -No hematuria or dysuria. -Denied fever or chills -Pain was described as achy and 4/10. Given toradol.  -UA noted moderate blood, 6-10 RBC, >50 WBC and rare bacteria. No urine culture. -Leukocytosis at seventeen thousand -CT renal stone study noted mild left-sided hydronephrosis secondary to an obstructing 1.1 cm stone in the left renal pelvis. Bilateral nonobstructing nephrolithiasis.  -No history of kidney stones.  -He is currently on percocet for pain left flank and back pain.  -Dr. Diamantina Bolton was contacted by the ED early this morning however no mention of his pyuria and leukocytosis was made and it was requested patient come in the office this morning for further evaluation -Still complaining of pain   PMH: Past Medical History:  Diagnosis Date  . Sleep apnea     Surgical History: Past Surgical History:  Procedure Laterality Date  . COLONOSCOPY WITH PROPOFOL N/A 01/06/2018   Procedure: COLONOSCOPY WITH PROPOFOL;  Surgeon: Dillon Bellows, MD;  Location: [facility] ENDOSCOPY;  Service: Gastroenterology;  Laterality: N/A;  . KNEE SURGERY Bilateral 1975   removed osteochondromas at age 34  . NOSE SURGERY     to help with OSA, opened up passages  . TONSILLECTOMY    . WISDOM TOOTH EXTRACTION      Home Medications:  Allergies as of 07/19/2020   No Known Allergies     Medication List       Accurate as of July 19, 2020 10:33 AM. If you have any questions, ask your nurse or doctor.        atorvastatin 40 MG tablet Commonly known as: LIPITOR TAKE 1 TABLET BY MOUTH  EVERY DAY   empagliflozin 10 MG Tabs tablet Commonly known as: Jardiance Take 1 tablet (10 mg total) by mouth daily before breakfast.   metFORMIN 1000 MG tablet Commonly known as: GLUCOPHAGE TAKE 1 TABLET (1,000 MG TOTAL) BY MOUTH 2 (TWO) TIMES DAILY WITH A MEAL.   oxyCODONE-acetaminophen 5-325 MG tablet Commonly known as: Percocet Take 1 tablet by mouth every 4 (four) hours as needed.       Allergies: No Known Allergies  Family History: Family History  Problem Relation Age of Onset  . Hypertension Mother   . Colon cancer Father   . Hypertension Brother   . Prostate cancer Neg Hx     Social History:  reports that he has been smoking cigarettes. He started smoking about 34 years ago. He has been smoking about 0.50 packs per day. He has never used smokeless tobacco. He reports current alcohol use of about 1.0 - 2.0 standard drink of alcohol per week. He reports that he does not use drugs.   Physical Exam: BP 138/82   Pulse (!) 102   Ht 6\' 1"  (1.854 m)   Wt (!) 317 lb (143.8 kg)   BMI 41.82 kg/m   Constitutional:  Alert and oriented, No acute distress. HEENT: [place] AT, moist mucus membranes.  Trachea midline, no masses. Cardiovascular: No clubbing, cyanosis, or edema.  RRR Respiratory: Normal respiratory effort, no increased work of breathing.  Clear Skin: No rashes,  bruises or suspicious lesions. Neurologic: Grossly intact, no focal deficits, moving all 4 extremities. Psychiatric: Normal mood and affect.  Laboratory Data:  Lab Results  Component Value Date   CREATININE 0.91 07/18/2020     Pertinent Imaging:  CT Renal Stone Study  Narrative CLINICAL DATA:  Flank pain.  EXAM: CT ABDOMEN AND PELVIS WITHOUT CONTRAST  TECHNIQUE: Multidetector CT imaging of the abdomen and pelvis was performed following the standard protocol without IV contrast.  COMPARISON:  None.  FINDINGS: Lower chest: The lung bases are clear. The heart size is normal.  Hepatobiliary:  There is decreased hepatic attenuation suggestive of hepatic steatosis. Normal gallbladder.There is no biliary ductal dilation.  Pancreas: Normal contours without ductal dilatation. No peripancreatic fluid collection.  Spleen: Unremarkable.  Adrenals/Urinary Tract:  --Adrenal glands: Unremarkable.  --Right kidney/ureter: There are multiple small nonobstructing stones in the right kidney measuring up to approximately 4 mm. No right-sided hydronephrosis.  --Left kidney/ureter: There is mild left-sided hydronephrosis secondary to an obstructing 1.1 cm stone in the left renal pelvis. Additional nonobstructing punctate stones are noted in the lower pole the left kidney. There are no left ureteral stones.  --Urinary bladder: Unremarkable.  Stomach/Bowel:  --Stomach/Duodenum: No hiatal hernia or other gastric abnormality. Normal duodenal course and caliber.  --Small bowel: Unremarkable.  --Colon: Unremarkable.  --Appendix: Normal.  Vascular/Lymphatic: Atherosclerotic calcification is present within the non-aneurysmal abdominal aorta, without hemodynamically significant stenosis.  --there are few mildly enlarged retroperitoneal lymph nodes, likely reactive.  --No mesenteric lymphadenopathy.  --No pelvic or inguinal lymphadenopathy.  Reproductive: Unremarkable  Other: No ascites or free air. The abdominal wall is normal.  Musculoskeletal. No acute displaced fractures.  IMPRESSION: 1. Mild left-sided hydronephrosis secondary to an obstructing 1.1 cm stone in the left renal pelvis. 2. Bilateral nonobstructing nephrolithiasis. 3. Hepatic steatosis.  Aortic Atherosclerosis (ICD10-I70.0).   Electronically Signed By: Dillon Bolton M.D. On: 07/19/2020 00:28  I have personally reviewed the images and agree with radiologist interpretation.    Assessment & Plan:    1. Obstructing left renal pelvic stone  UA with pyuria and leukocytosis.    We discussed the  possibility of an obstructing stone with infection which is an urgent/potentially emergent problem  He is presently afebrile  Urine was not sent for culture in the ED and we discussed sending urine for culture and giving him instructions to return to the ED should he develop fever  The alternative of cystoscopy with left ureteral stent placement was also discussed which I think is the safer choice as it would prevent the possibility of clinical deterioration and also treat his pain until the stone can be treated.  He would like to proceed with stent placement today.  The procedure was discussed in detail including potential risks of bleeding, infection/sepsis, ureteral injury as well as the possibility of irritative stent symptoms.  It was stressed that we will be no attempt to remove his stone due to the possibility of bacteremia/sepsis and he will need definitive stone treatment at a later date. We discussed various treatment options for urolithiasis including observation with or without medical expulsive therapy, shockwave lithotripsy (SWL), ureteroscopy and laser lithotripsy with stent placement, and percutaneous nephrolithotomy.  We discussed that management is based on stone size, location, density, patient co-morbidities, and patient preference.   Stones <24mm in size have a >80% spontaneous passage rate. Data surrounding the use of tamsulosin for medical expulsive therapy is controversial, but meta analyses suggests it is most efficacious for distal stones between  5-63mm in size. Possible side effects include dizziness/lightheadedness, and retrograde ejaculation.  SWL has a lower stone free rate in a single procedure, but also a lower complication rate compared to ureteroscopy and avoids a stent and associated stent related symptoms. Possible complications include renal hematoma, steinstrasse, and need for additional treatment.  Ureteroscopy with laser lithotripsy and stent placement has a  higher stone free rate than SWL in a single procedure, however increased complication rate including possible infection, ureteral injury, bleeding, and stent related morbidity. Common stent related symptoms include dysuria, urgency/frequency, and flank pain   He will think over these options and make a decision after stent placement   2. Bilateral nonobstructing nephrolithiasis  Recommend a metabolic stone work up after acute episode from #1 resolves.    [place] [address] [place], [place] [address] [phone]  I, Selena Batten, am acting as a scribe for Dr. Nicki Reaper C. Camrie Stock,  I have reviewed the above documentation for accuracy and completeness, and I agree with the above.   Abbie Sons, MD

## 2020-07-19 NOTE — Discharge Instructions (Signed)
DISCHARGE INSTRUCTIONS FOR KIDNEY STONE/URETERAL STENT   MEDICATIONS:  1. Resume all your other meds from home.  2.  AZO (over-the-counter) can help with the burning/stinging when you urinate. 3.  Prescriptions for oxybutynin and tamsulosin were sent to your pharmacy for stent irritation  ACTIVITY:  1. May resume regular activities in 24 hours. 2. No driving while on narcotic pain medications  3. Drink plenty of water  4. Continue to walk at home - you can still get blood clots when you are at home, so keep active, but don't over do it.  5. May return to work/school tomorrow or when you feel ready    SIGNS/SYMPTOMS TO CALL:  Please call us if you have a fever greater than 101.5, uncontrolled nausea/vomiting, uncontrolled pain, dizziness, unable to urinate, excessively bloody urine, chest pain, shortness of breath, leg swelling, leg pain, or any other concerns or questions.   Common postop symptoms include urinary frequency, urgency and bladder spasm  You can reach [place] at [phone].   FOLLOW-UP:  1. You will be contacted for an appointment regarding treatment of your stone

## 2020-07-19 NOTE — H&P (View-Only) (Signed)
07/19/2020 10:33 AM   [facility] [birth year] [id number]  Referring provider: Virginia Crews, [place],  [place] [id number] Chief Complaint  Patient presents with  . Nephrolithiasis    HPI: [facility] is a 54 y.o. male who presents today for evaluation and management of kidney stone.  -seen in ED earlier this morning for left flank pain. -Associated nausea and vomiting.  -No hematuria or dysuria. -Denied fever or chills -Pain was described as achy and 4/10. Given toradol.  -UA noted moderate blood, 6-10 RBC, >50 WBC and rare bacteria. No urine culture. -Leukocytosis at seventeen thousand -CT renal stone study noted mild left-sided hydronephrosis secondary to an obstructing 1.1 cm stone in the left renal pelvis. Bilateral nonobstructing nephrolithiasis.  -No history of kidney stones.  -He is currently on percocet for pain left flank and back pain.  -Dr. Diamantina Providence was contacted by the ED early this morning however no mention of his pyuria and leukocytosis was made and it was requested patient come in the office this morning for further evaluation -Still complaining of pain   PMH: Past Medical History:  Diagnosis Date  . Sleep apnea     Surgical History: Past Surgical History:  Procedure Laterality Date  . COLONOSCOPY WITH PROPOFOL N/A 01/06/2018   Procedure: COLONOSCOPY WITH PROPOFOL;  Surgeon: Jonathon Bellows, MD;  Location: [facility] ENDOSCOPY;  Service: Gastroenterology;  Laterality: N/A;  . KNEE SURGERY Bilateral 1975   removed osteochondromas at age 43  . NOSE SURGERY     to help with OSA, opened up passages  . TONSILLECTOMY    . WISDOM TOOTH EXTRACTION      Home Medications:  Allergies as of 07/19/2020   No Known Allergies     Medication List       Accurate as of July 19, 2020 10:33 AM. If you have any questions, ask your nurse or doctor.        atorvastatin 40 MG tablet Commonly known as: LIPITOR TAKE 1 TABLET BY MOUTH  EVERY DAY   empagliflozin 10 MG Tabs tablet Commonly known as: Jardiance Take 1 tablet (10 mg total) by mouth daily before breakfast.   metFORMIN 1000 MG tablet Commonly known as: GLUCOPHAGE TAKE 1 TABLET (1,000 MG TOTAL) BY MOUTH 2 (TWO) TIMES DAILY WITH A MEAL.   oxyCODONE-acetaminophen 5-325 MG tablet Commonly known as: Percocet Take 1 tablet by mouth every 4 (four) hours as needed.       Allergies: No Known Allergies  Family History: Family History  Problem Relation Age of Onset  . Hypertension Mother   . Colon cancer Father   . Hypertension Brother   . Prostate cancer Neg Hx     Social History:  reports that he has been smoking cigarettes. He started smoking about 34 years ago. He has been smoking about 0.50 packs per day. He has never used smokeless tobacco. He reports current alcohol use of about 1.0 - 2.0 standard drink of alcohol per week. He reports that he does not use drugs.   Physical Exam: BP 138/82   Pulse (!) 102   Ht 6\' 1"  (1.854 m)   Wt (!) 317 lb (143.8 kg)   BMI 41.82 kg/m   Constitutional:  Alert and oriented, No acute distress. HEENT: [place] AT, moist mucus membranes.  Trachea midline, no masses. Cardiovascular: No clubbing, cyanosis, or edema.  RRR Respiratory: Normal respiratory effort, no increased work of breathing.  Clear Skin: No rashes,  bruises or suspicious lesions. Neurologic: Grossly intact, no focal deficits, moving all 4 extremities. Psychiatric: Normal mood and affect.  Laboratory Data:  Lab Results  Component Value Date   CREATININE 0.91 07/18/2020     Pertinent Imaging:  CT Renal Stone Study  Narrative CLINICAL DATA:  Flank pain.  EXAM: CT ABDOMEN AND PELVIS WITHOUT CONTRAST  TECHNIQUE: Multidetector CT imaging of the abdomen and pelvis was performed following the standard protocol without IV contrast.  COMPARISON:  None.  FINDINGS: Lower chest: The lung bases are clear. The heart size is normal.  Hepatobiliary:  There is decreased hepatic attenuation suggestive of hepatic steatosis. Normal gallbladder.There is no biliary ductal dilation.  Pancreas: Normal contours without ductal dilatation. No peripancreatic fluid collection.  Spleen: Unremarkable.  Adrenals/Urinary Tract:  --Adrenal glands: Unremarkable.  --Right kidney/ureter: There are multiple small nonobstructing stones in the right kidney measuring up to approximately 4 mm. No right-sided hydronephrosis.  --Left kidney/ureter: There is mild left-sided hydronephrosis secondary to an obstructing 1.1 cm stone in the left renal pelvis. Additional nonobstructing punctate stones are noted in the lower pole the left kidney. There are no left ureteral stones.  --Urinary bladder: Unremarkable.  Stomach/Bowel:  --Stomach/Duodenum: No hiatal hernia or other gastric abnormality. Normal duodenal course and caliber.  --Small bowel: Unremarkable.  --Colon: Unremarkable.  --Appendix: Normal.  Vascular/Lymphatic: Atherosclerotic calcification is present within the non-aneurysmal abdominal aorta, without hemodynamically significant stenosis.  --there are few mildly enlarged retroperitoneal lymph nodes, likely reactive.  --No mesenteric lymphadenopathy.  --No pelvic or inguinal lymphadenopathy.  Reproductive: Unremarkable  Other: No ascites or free air. The abdominal wall is normal.  Musculoskeletal. No acute displaced fractures.  IMPRESSION: 1. Mild left-sided hydronephrosis secondary to an obstructing 1.1 cm stone in the left renal pelvis. 2. Bilateral nonobstructing nephrolithiasis. 3. Hepatic steatosis.  Aortic Atherosclerosis (ICD10-I70.0).   Electronically Signed By: Constance Holster M.D. On: 07/19/2020 00:28  I have personally reviewed the images and agree with radiologist interpretation.    Assessment & Plan:    1. Obstructing left renal pelvic stone  UA with pyuria and leukocytosis.    We discussed the  possibility of an obstructing stone with infection which is an urgent/potentially emergent problem  He is presently afebrile  Urine was not sent for culture in the ED and we discussed sending urine for culture and giving him instructions to return to the ED should he develop fever  The alternative of cystoscopy with left ureteral stent placement was also discussed which I think is the safer choice as it would prevent the possibility of clinical deterioration and also treat his pain until the stone can be treated.  He would like to proceed with stent placement today.  The procedure was discussed in detail including potential risks of bleeding, infection/sepsis, ureteral injury as well as the possibility of irritative stent symptoms.  It was stressed that we will be no attempt to remove his stone due to the possibility of bacteremia/sepsis and he will need definitive stone treatment at a later date. We discussed various treatment options for urolithiasis including observation with or without medical expulsive therapy, shockwave lithotripsy (SWL), ureteroscopy and laser lithotripsy with stent placement, and percutaneous nephrolithotomy.  We discussed that management is based on stone size, location, density, patient co-morbidities, and patient preference.   Stones <32mm in size have a >80% spontaneous passage rate. Data surrounding the use of tamsulosin for medical expulsive therapy is controversial, but meta analyses suggests it is most efficacious for distal stones between  5-19mm in size. Possible side effects include dizziness/lightheadedness, and retrograde ejaculation.  SWL has a lower stone free rate in a single procedure, but also a lower complication rate compared to ureteroscopy and avoids a stent and associated stent related symptoms. Possible complications include renal hematoma, steinstrasse, and need for additional treatment.  Ureteroscopy with laser lithotripsy and stent placement has a  higher stone free rate than SWL in a single procedure, however increased complication rate including possible infection, ureteral injury, bleeding, and stent related morbidity. Common stent related symptoms include dysuria, urgency/frequency, and flank pain   He will think over these options and make a decision after stent placement   2. Bilateral nonobstructing nephrolithiasis  Recommend a metabolic stone work up after acute episode from #1 resolves.    [facility] [address], [place], [place] [address] [phone]  I, Selena Batten, am acting as a scribe for Dr. Nicki Reaper C. Hallee Mckenny,  I have reviewed the above documentation for accuracy and completeness, and I agree with the above.   Abbie Sons, MD

## 2020-07-19 NOTE — Op Note (Signed)
Preoperative diagnosis:  1. Left renal pelvic calculus with obstruction 2. Renal colic 3. Possible UTI  Postoperative diagnosis:  1. Left renal pelvic calculus with obstruction 2. Renal colic 3. Pyonephrosis  Procedure:  1. Cystoscopy 2. Left ureteral stent placement (6FR/26 cm) 3. Left retrograde pyelography with interpretation   Surgeon: Nicki Reaper C. Demitrus Francisco, M.D.  Anesthesia: General  Complications: None  Intraoperative findings:  1. Cystoscopic-urethra normal in caliber without stricture; mild lateral lobe enlargement with moderate bladder neck elevation; ureteral orifices orthotopic, no efflux noted from left; mild bladder mucosal erythema 2. Left retrograde pyelogram-faint density noted left renal pelvis with moderate left hydronephrosis 3. Purulent urine aspirated from left renal pelvis  EBL: Minimal  Specimens: Urine left renal pelvis for urine culture  Indication: HELDER CRISAFULLI is a 54 y.o. male who presented to the ED early 07/19/2020 with left renal colic.  Stone protocol CT remarkable for an 11 mm left renal pelvic calculus with mild hydronephrosis.  Urinalysis showed >50 WBCs and WBC 17,000.  He has been afebrile however was asked to come in this morning for evaluation for possible stent placement.  After reviewing the management options for treatment, he elected to proceed with the above surgical procedure(s). We have discussed the potential benefits and risks of the procedure, side effects of the proposed treatment, the likelihood of the patient achieving the goals of the procedure, and any potential problems that might occur during the procedure or recuperation. Informed consent has been obtained.  Description of procedure:  The patient was taken to the operating room and general anesthesia was induced.  The patient was placed in the dorsal lithotomy position, prepped and draped in the usual sterile fashion, and preoperative antibiotics were administered. A preoperative  time-out was performed.   A 21 French cystoscope was lubricated and passed per urethra and advanced proximally into the bladder with findings as described above.  A 0.38 sensor guidewire was then advanced up the left ureter into the renal pelvis under fluoroscopic guidance.  A 5 French open-ended ureteral catheter was advanced over the guidewire to the region of the renal pelvis.  The guidewire was removed and purulent urine was aspirated as described above.  The guidewire was replaced and the ureteral catheter was removed.  A 6FR/26 cm Contour ureteral stent was advanced over the guidewire without difficulty.  The renal pelvis was small and the proximal stent tip was within a midpole calyx.  The distal end of the stent was well positioned in the bladder.  The bladder was then emptied and the procedure ended.  The patient appeared to tolerate the procedure well and without complications.  The patient was able to be awakened and transferred to the recovery unit in satisfactory condition.   Plan: He will be discharged if he remains afebrile and vital signs stable in the postoperative recovery area.  He will be discharged on oral antibiotics pending culture and scheduled for definitive stone treatment.   [name], MD

## 2020-07-19 NOTE — H&P (View-Only) (Signed)
07/19/2020 10:33 AM   [facility] 27-Feb-1966 [id number]  Referring provider: Virginia Bolton, [facility],  [facility] [id number] Chief Complaint  Patient presents with  . Nephrolithiasis    HPI: [facility] is a 54 y.o. male who presents today for evaluation and management of kidney stone.  -seen in ED earlier this morning for left flank pain. -Associated nausea and vomiting.  -No hematuria or dysuria. -Denied fever or chills -Pain was described as achy and 4/10. Given toradol.  -UA noted moderate blood, 6-10 RBC, >50 WBC and rare bacteria. No urine culture. -Leukocytosis [place] seventeen thousand -CT renal stone study noted mild left-sided hydronephrosis secondary to an obstructing 1.1 cm stone in the left renal pelvis. Bilateral nonobstructing nephrolithiasis.  -No history of kidney stones.  -He is currently on percocet for pain left flank and back pain.  -Dr. Diamantina Bolton was contacted by the ED early this morning however no mention of his pyuria and leukocytosis was made and it was requested patient come in the office this morning for further evaluation -Still complaining of pain   PMH: Past Medical History:  Diagnosis Date  . Sleep apnea     Surgical History: Past Surgical History:  Procedure Laterality Date  . COLONOSCOPY WITH PROPOFOL N/A 01/06/2018   Procedure: COLONOSCOPY WITH PROPOFOL;  Surgeon: [facility] Bellows, MD;  Location: [facility];  Service: Gastroenterology;  Laterality: N/A;  . KNEE SURGERY Bilateral 1975   removed osteochondromas [facility] age 43  . NOSE SURGERY     to help with OSA, opened up passages  . TONSILLECTOMY    . WISDOM TOOTH EXTRACTION      Home Medications:  Allergies as of 07/19/2020   No Known Allergies     Medication List       Accurate as of July 19, 2020 10:33 AM. If you have any questions, ask your nurse or doctor.        atorvastatin 40 MG tablet Commonly known as: LIPITOR TAKE 1 TABLET BY MOUTH  EVERY DAY   empagliflozin 10 MG Tabs tablet Commonly known as: Jardiance Take 1 tablet (10 mg total) by mouth daily before breakfast.   metFORMIN 1000 MG tablet Commonly known as: GLUCOPHAGE TAKE 1 TABLET (1,000 MG TOTAL) BY MOUTH 2 (TWO) TIMES DAILY WITH A MEAL.   oxyCODONE-acetaminophen 5-325 MG tablet Commonly known as: Percocet Take 1 tablet by mouth every 4 (four) hours as needed.       Allergies: No Known Allergies  Family History: Family History  Problem Relation Age of Onset  . Hypertension Mother   . Colon cancer Father   . Hypertension Brother   . Prostate cancer Neg Hx     Social History:  reports that he has been smoking cigarettes. He started smoking about 34 years ago. He has been smoking about 0.50 packs per day. He has never used smokeless tobacco. He reports current alcohol use of about 1.0 - 2.0 standard drink of alcohol per week. He reports that he does not use drugs.   Physical Exam: BP 138/82   Pulse (!) 102   Ht 6\' 1"  (1.854 m)   Wt (!) 317 lb (143.8 kg)   BMI 41.82 kg/m   Constitutional:  Alert and oriented, No acute distress. HEENT: [facility], moist mucus membranes.  Trachea midline, no masses. Cardiovascular: No clubbing, cyanosis, or edema.  RRR Respiratory: Normal respiratory effort, no increased work of breathing.  Clear Skin: No rashes,  bruises or suspicious lesions. Neurologic: Grossly intact, no focal deficits, moving all 4 extremities. Psychiatric: Normal mood and affect.  Laboratory Data:  Lab Results  Component Value Date   CREATININE 0.91 07/18/2020     Pertinent Imaging:  CT Renal Stone Study  Narrative CLINICAL DATA:  Flank pain.  EXAM: CT ABDOMEN AND PELVIS WITHOUT CONTRAST  TECHNIQUE: Multidetector CT imaging of the abdomen and pelvis was performed following the standard protocol without IV contrast.  COMPARISON:  None.  FINDINGS: Lower chest: The lung bases are clear. The heart size is normal.  Hepatobiliary:  There is decreased hepatic attenuation suggestive of hepatic steatosis. Normal gallbladder.There is no biliary ductal dilation.  Pancreas: Normal contours without ductal dilatation. No peripancreatic fluid collection.  Spleen: Unremarkable.  Adrenals/Urinary Tract:  --Adrenal glands: Unremarkable.  --Right kidney/ureter: There are multiple small nonobstructing stones in the right kidney measuring up to approximately 4 mm. No right-sided hydronephrosis.  --Left kidney/ureter: There is mild left-sided hydronephrosis secondary to an obstructing 1.1 cm stone in the left renal pelvis. Additional nonobstructing punctate stones are noted in the lower pole the left kidney. There are no left ureteral stones.  --Urinary bladder: Unremarkable.  Stomach/Bowel:  --Stomach/Duodenum: No hiatal hernia or other gastric abnormality. Normal duodenal course and caliber.  --Small bowel: Unremarkable.  --Colon: Unremarkable.  --Appendix: Normal.  Vascular/Lymphatic: Atherosclerotic calcification is present within the non-aneurysmal abdominal aorta, without hemodynamically significant stenosis.  --there are few mildly enlarged retroperitoneal lymph nodes, likely reactive.  --No mesenteric lymphadenopathy.  --No pelvic or inguinal lymphadenopathy.  Reproductive: Unremarkable  Other: No ascites or free air. The abdominal wall is normal.  Musculoskeletal. No acute displaced fractures.  IMPRESSION: 1. Mild left-sided hydronephrosis secondary to an obstructing 1.1 cm stone in the left renal pelvis. 2. Bilateral nonobstructing nephrolithiasis. 3. Hepatic steatosis.  Aortic Atherosclerosis (ICD10-I70.0).   Electronically Signed By: [facility] M.D. On: 07/19/2020 00:28  I have personally reviewed the images and agree with radiologist interpretation.    Assessment & Plan:    1. Obstructing left renal pelvic stone  UA with pyuria and leukocytosis.    We discussed the  possibility of an obstructing stone with infection which is an urgent/potentially emergent problem  He is presently afebrile  Urine was not sent for culture in the ED and we discussed sending urine for culture and giving him instructions to return to the ED should he develop fever  The alternative of cystoscopy with left ureteral stent placement was also discussed which I think is the safer choice as it would prevent the possibility of clinical deterioration and also treat his pain until the stone can be treated.  He would like to proceed with stent placement today.  The procedure was discussed in detail including potential risks of bleeding, infection/sepsis, ureteral injury as well as the possibility of irritative stent symptoms.  It was stressed that we will be no attempt to remove his stone due to the possibility of bacteremia/sepsis and he will need definitive stone treatment [facility] a later date. We discussed various treatment options for urolithiasis including observation with or without medical expulsive therapy, shockwave lithotripsy (SWL), ureteroscopy and laser lithotripsy with stent placement, and percutaneous nephrolithotomy.  We discussed that management is based on stone size, location, density, patient co-morbidities, and patient preference.   Stones <60mm in size have a >80% spontaneous passage rate. Data surrounding the use of tamsulosin for medical expulsive therapy is controversial, but meta analyses suggests it is most efficacious for distal stones between  5-69mm in size. Possible side effects include dizziness/lightheadedness, and retrograde ejaculation.  SWL has a lower stone free rate in a single procedure, but also a lower complication rate compared to ureteroscopy and avoids a stent and associated stent related symptoms. Possible complications include renal hematoma, steinstrasse, and need for additional treatment.  Ureteroscopy with laser lithotripsy and stent placement has a  higher stone free rate than SWL in a single procedure, however increased complication rate including possible infection, ureteral injury, bleeding, and stent related morbidity. Common stent related symptoms include dysuria, urgency/frequency, and flank pain   He will think over these options and make a decision after stent placement   2. Bilateral nonobstructing nephrolithiasis  Recommend a metabolic stone work up after acute episode from #1 resolves.    [facility] [address], [place], [place] [address] [phone]  I, Selena Batten, am acting as a scribe for Dr. Nicki Reaper C. Oneika Simonian,  I have reviewed the above documentation for accuracy and completeness, and I agree with the above.   Abbie Sons, MD

## 2020-07-19 NOTE — Transfer of Care (Signed)
Immediate Anesthesia Transfer of Care Note  Patient: [facility]  Procedure(s) Performed: CYSTOSCOPY WITH STENT PLACEMENT (Left )  Patient Location: PACU  Anesthesia Type:General  Level of Consciousness: awake, alert , oriented and patient cooperative  Airway & Oxygen Therapy: Patient Spontanous Breathing  Post-op Assessment: Report given to RN and Post -op Vital signs reviewed and stable  Post vital signs: Reviewed and stable  Last Vitals:  Vitals Value Taken Time  BP 109/51 07/19/20 1607  Temp    Pulse 105 07/19/20 1608  Resp    SpO2 95 % 07/19/20 1608  Vitals shown include unvalidated device data.  Last Pain:  Vitals:   07/19/20 1315  TempSrc: Oral         Complications: No complications documented.

## 2020-07-19 NOTE — Anesthesia Postprocedure Evaluation (Signed)
Anesthesia Post Note  Patient: [facility]  Procedure(s) Performed: CYSTOSCOPY WITH STENT PLACEMENT (Left )  Patient location during evaluation: PACU Anesthesia Type: General Level of consciousness: awake and alert Pain management: pain level controlled Vital Signs Assessment: post-procedure vital signs reviewed and stable Respiratory status: spontaneous breathing and respiratory function stable Cardiovascular status: stable Anesthetic complications: no   No complications documented.   Last Vitals:  Vitals:   07/19/20 1608 07/19/20 1623  BP: (!) 109/51 128/65  Pulse: (!) 105 97  Resp: 16 14  Temp: 36.5 C   SpO2: 95% 96%    Last Pain:  Vitals:   07/19/20 1608  TempSrc:   PainSc: 0-No pain                 Othello Dickenson K

## 2020-07-19 NOTE — Interval H&P Note (Signed)
History and Physical Interval Note:  07/19/2020 3:21 PM  [facility]  has presented today for surgery, with the diagnosis of right renal calculus.  The various methods of treatment have been discussed with the patient and family. After consideration of risks, benefits and other options for treatment, the patient has consented to  Procedure(s): [name] (Left) as a surgical intervention.  The patient's history has been reviewed, patient examined, no change in status, stable for surgery.  I have reviewed the patient's chart and labs.  Questions were answered to the patient's satisfaction.     [place]

## 2020-07-20 ENCOUNTER — Encounter: Payer: Self-pay | Admitting: Urology

## 2020-07-22 ENCOUNTER — Other Ambulatory Visit: Payer: Self-pay | Admitting: Urology

## 2020-07-22 DIAGNOSIS — N200 Calculus of kidney: Secondary | ICD-10-CM

## 2020-07-22 LAB — URINE CULTURE: Culture: >=100000 — AB

## 2020-07-22 MED ORDER — AMOXICILLIN 875 MG PO TABS: 875.0000 mg | ORAL_TABLET | Freq: Two times a day (BID) | ORAL | 0 refills | Status: AC

## 2020-07-22 NOTE — Progress Notes (Signed)
Hi Dr Bernardo Heater,  Wasn't sure if you got this result as well from his recent kidney stone in the hospital.  Were you going to switch his abx, or would you like for me to? Thanks! Lavon Paganini

## 2020-07-23 ENCOUNTER — Other Ambulatory Visit: Payer: Self-pay

## 2020-07-23 ENCOUNTER — Encounter: Payer: Self-pay | Admitting: Family Medicine

## 2020-07-23 ENCOUNTER — Ambulatory Visit
Admission: RE | Admit: 2020-07-23 | Discharge: 2020-07-23 | Disposition: A | Discharge status: Home / Self Care | Payer: 59 | Source: Ambulatory Visit | Attending: Urology | Admitting: Urology

## 2020-07-23 ENCOUNTER — Ambulatory Visit
Admission: RE | Admit: 2020-07-23 | Discharge: 2020-07-23 | Disposition: A | Discharge status: Home / Self Care | Payer: 59 | Attending: Urology | Admitting: Urology

## 2020-07-23 ENCOUNTER — Ambulatory Visit (INDEPENDENT_AMBULATORY_CARE_PROVIDER_SITE_OTHER): Payer: 59 | Admitting: Family Medicine

## 2020-07-23 VITALS — BP 131/77 | HR 83 | Temp 98.9°F | Wt 316.0 lb

## 2020-07-23 DIAGNOSIS — E1169 Type 2 diabetes mellitus with other specified complication: Secondary | ICD-10-CM | POA: Diagnosis not present

## 2020-07-23 DIAGNOSIS — N200 Calculus of kidney: Secondary | ICD-10-CM | POA: Insufficient documentation

## 2020-07-23 DIAGNOSIS — E785 Hyperlipidemia, unspecified: Secondary | ICD-10-CM

## 2020-07-23 DIAGNOSIS — E1159 Type 2 diabetes mellitus with other circulatory complications: Secondary | ICD-10-CM

## 2020-07-23 DIAGNOSIS — I152 Hypertension secondary to endocrine disorders: Secondary | ICD-10-CM | POA: Diagnosis not present

## 2020-07-23 LAB — CULTURE, URINE COMPREHENSIVE

## 2020-07-23 LAB — POCT GLYCOSYLATED HEMOGLOBIN (HGB A1C): Hemoglobin A1C: 6.4 % — AB (ref 4.0–5.6)

## 2020-07-23 NOTE — Assessment & Plan Note (Signed)
LDL slightly elevated at last check Given recent labwork, will hold on checking today and repeat at CPE Continue statin

## 2020-07-23 NOTE — Progress Notes (Signed)
Established patient visit   Patient: Dillon Bolton   DOB: 1966-03-24   54 y.o. Male  MRN: [id number] Visit Date: 07/23/2020  Today's healthcare provider: Lavon Paganini, MD   Chief Complaint  Patient presents with  . Diabetes  . Hyperlipidemia  . Hypertension   Subjective    HPI  Diabetes Mellitus Type II, follow-up  Lab Results  Component Value Date   HGBA1C 6.4 (A) 07/23/2020   HGBA1C 7.2 (A) 04/01/2020   HGBA1C 6.8 (H) 09/28/2019   Last seen for diabetes 4 months ago.  Management since then includes starting Jardiance 10mg  a day. He reports excellent compliance with treatment. He is not having side effects.   Home blood sugar records: BS are not being checked  Episodes of hypoglycemia? No    Current insulin regiment: none Most Recent Eye Exam: needs  --------------------------------------------------------------------------------------------------- Hypertension, follow-up  BP Readings from Last 3 Encounters:  07/23/20 131/77  07/19/20 136/70  07/19/20 138/82   Wt Readings from Last 3 Encounters:  07/23/20 (!) 316 lb (143.3 kg)  07/19/20 (!) 317 lb 0.3 oz (143.8 kg)  07/19/20 (!) 317 lb (143.8 kg)     He was last seen for hypertension 4 months ago.  Management since that visit includes no changes. He reports excellent compliance with treatment. He is not having side effects.  He is exercising. He is adherent to low salt diet.   Outside blood pressures are .  He does not smoke.  Use of agents associated with hypertension: none.   --------------------------------------------------------------------------------------------------- Lipid/Cholesterol, follow-up  Last Lipid Panel: Lab Results  Component Value Date   CHOL 130 09/28/2019   LDLCALC 76 09/28/2019   HDL 40 09/28/2019   TRIG 68 09/28/2019    He was last seen for this 4 months ago.  Management since that visit includes no changes.  He reports excellent compliance with  treatment. He is not having side effects.   Symptoms: No appetite changes No foot ulcerations  No chest pain No chest pressure/discomfort  No dyspnea No orthopnea  No fatigue No lower extremity edema  No palpitations No paroxysmal nocturnal dyspnea  No nausea No numbness or tingling of extremity  No polydipsia No polyuria  No speech difficulty No syncope   He is following a Regular diet.   Last metabolic panel Lab Results  Component Value Date   GLUCOSE 138 (H) 07/18/2020   NA 138 07/18/2020   K 3.9 07/18/2020   BUN 14 07/18/2020   CREATININE 0.91 07/18/2020   GFRNONAA >60 07/18/2020   GFRAA 124 09/28/2019   CALCIUM 9.0 07/18/2020   AST 18 07/18/2020   ALT 28 07/18/2020   The 10-year ASCVD risk score Mikey Bussing DC Jr., et al., 2013) is: 14%  ---------------------------------------------------------------------------------------------------   Patient Active Problem List   Diagnosis Date Noted  . Chest pain 09/28/2019  . Hypertension associated with diabetes ([place]) 06/16/2019  . T2DM (type 2 diabetes mellitus) ([place]) 05/18/2018  . Hyperlipidemia associated with type 2 diabetes mellitus ([place]) 05/17/2018  . Morbid obesity ([place]) 11/09/2017  . Tobacco use disorder 11/09/2017  . Psoriasis 11/09/2017  . Sleep apnea    Past Medical History:  Diagnosis Date  . Diabetes mellitus without complication ([place])   . Sleep apnea    Social History   Tobacco Use  . Smoking status: Current Every Day Smoker    Packs/day: 0.50    Types: Cigarettes    Start date: 27  . Smokeless tobacco: Never Used  .  Tobacco comment: started smoking at age 36. Is smoking 0.25-0.75 PPD, which is decreased from 1.5-2 PPD  Vaping Use  . Vaping Use: Never used  Substance Use Topics  . Alcohol use: Yes    Alcohol/week: 1.0 - 2.0 standard drink    Types: 1 - 2 Cans of beer per week    Comment: occasional  . Drug use: No   No Known Allergies   Medications: Outpatient Medications Prior to Visit    Medication Sig  . amoxicillin (AMOXIL) 875 MG tablet Take 1 tablet (875 mg total) by mouth every 12 (twelve) hours for 10 days.  Marland Kitchen atorvastatin (LIPITOR) 40 MG tablet TAKE 1 TABLET BY MOUTH EVERY DAY  . empagliflozin (JARDIANCE) 10 MG TABS tablet Take 1 tablet (10 mg total) by mouth daily before breakfast.  . meloxicam (MOBIC) 7.5 MG tablet Take 15 mg by mouth daily.  . metFORMIN (GLUCOPHAGE) 1000 MG tablet TAKE 1 TABLET (1,000 MG TOTAL) BY MOUTH 2 (TWO) TIMES DAILY WITH A MEAL.  Marland Kitchen oxybutynin (DITROPAN) 5 MG tablet 1 tab tid prn frequency,urgency, bladder spasm  . oxyCODONE-acetaminophen (PERCOCET) 5-325 MG tablet Take 1 tablet by mouth every 4 (four) hours as needed.  . tamsulosin (FLOMAX) 0.4 MG CAPS capsule Take 1 capsule (0.4 mg total) by mouth daily after breakfast.   No facility-administered medications prior to visit.    Review of Systems  Constitutional: Negative.   Respiratory: Negative.   Cardiovascular: Negative.   Neurological: Negative.       Objective    BP 131/77 (BP Location: Right Arm, Patient Position: Sitting, Cuff Size: Large)   Pulse 83   Temp 98.9 F (37.2 C) (Oral)   Wt (!) 316 lb (143.3 kg)   BMI 41.69 kg/m    Physical Exam Vitals reviewed.  Constitutional:      General: He is not in acute distress.    Appearance: Normal appearance. He is not diaphoretic.  HENT:     Head: Normocephalic and atraumatic.  Eyes:     General: No scleral icterus.    Conjunctiva/sclera: Conjunctivae normal.  Cardiovascular:     Rate and Rhythm: Normal rate and regular rhythm.     Pulses: Normal pulses.     Heart sounds: Normal heart sounds. No murmur heard.   Pulmonary:     Effort: Pulmonary effort is normal. No respiratory distress.     Breath sounds: Normal breath sounds. No wheezing or rhonchi.  Abdominal:     General: There is no distension.     Palpations: Abdomen is soft.     Tenderness: There is no abdominal tenderness.  Musculoskeletal:     Cervical  back: Neck supple.     Right lower leg: No edema.     Left lower leg: No edema.  Lymphadenopathy:     Cervical: No cervical adenopathy.  Skin:    General: Skin is warm and dry.     Capillary Refill: Capillary refill takes less than 2 seconds.     Findings: No rash.  Neurological:     Mental Status: He is alert and oriented to person, place, and time.     Cranial Nerves: No cranial nerve deficit.  Psychiatric:        Mood and Affect: Mood normal.        Behavior: Behavior normal.     Diabetic Foot Exam - Simple   Simple Foot Form Diabetic Foot exam was performed with the following findings: Yes 07/23/2020 10:18 AM  Visual Inspection  No deformities, no ulcerations, no other skin breakdown bilaterally: Yes Sensation Testing Intact to touch and monofilament testing bilaterally: Yes Pulse Check Posterior Tibialis and Dorsalis pulse intact bilaterally: Yes Comments      Results for orders placed or performed in visit on 07/23/20  POCT glycosylated hemoglobin (Hb A1C)  Result Value Ref Range   Hemoglobin A1C 6.4 (A) 4.0 - 5.6 %    Assessment & Plan     Problem List Items Addressed This Visit      Cardiovascular and Mediastinum   Hypertension associated with diabetes ([place]) - Primary    Well controlled Continue lifestyle management Reviewed recent metabolic panel F/u in 6 months         Endocrine   Hyperlipidemia associated with type 2 diabetes mellitus ([place])    LDL slightly elevated at last check Given recent labwork, will hold on checking today and repeat at CPE Continue statin      T2DM (type 2 diabetes mellitus) ([place])    A1c improved today Well controlled Associated with HTN, HLD, and morbid obesity Continue Jardiance and metformin Hold Jardiance while on abx for UTI currently Foot exam today Advised on need for eye exam On statin      Relevant Orders   POCT glycosylated hemoglobin (Hb A1C) (Completed)     Other   Morbid obesity ([place])    Discussed  importance of healthy weight management Discussed diet and exercise           Return in about 3 months (around 10/23/2020) for CPE.      I, Lavon Paganini, MD, have reviewed all documentation for this visit. The documentation on 07/23/20 for the exam, diagnosis, procedures, and orders are all accurate and complete.   Debria Broecker, Dionne Bucy, MD, MPH [facility]

## 2020-07-23 NOTE — Assessment & Plan Note (Signed)
Discussed importance of healthy weight management Discussed diet and exercise  

## 2020-07-23 NOTE — Assessment & Plan Note (Signed)
A1c improved today Well controlled Associated with HTN, HLD, and morbid obesity Continue Jardiance and metformin Hold Jardiance while on abx for UTI currently Foot exam today Advised on need for eye exam On statin

## 2020-07-23 NOTE — Assessment & Plan Note (Addendum)
Well controlled Continue lifestyle management Reviewed recent metabolic panel F/u in 6 months

## 2020-07-23 NOTE — Patient Instructions (Signed)
Hold Jardiance while on antibiotic - then resume

## 2020-07-24 ENCOUNTER — Other Ambulatory Visit: Payer: Self-pay | Admitting: Radiology

## 2020-07-24 ENCOUNTER — Telehealth: Payer: Self-pay | Admitting: Radiology

## 2020-07-24 DIAGNOSIS — N200 Calculus of kidney: Secondary | ICD-10-CM

## 2020-07-24 NOTE — Telephone Encounter (Signed)
LMOM to return call. Need to discuss stone treatment.

## 2020-07-25 NOTE — Progress Notes (Signed)
  [facility] Perioperative Services: Pre-Admission/Anesthesia Testing   Date: 07/25/20 Name: Kasyn Rolph [facility] MRN:   [id number]  Re: Consideration of preoperative prophylactic antibiotic change   Request sent to: Abbie Sons, MD (routed and/or faxed via [facility])  Planned Surgical Procedure(s):    Case: [id number] Date/Time: 07/30/20 1216   Procedure: CYSTOSCOPY/URETEROSCOPY/HOLMIUM LASER/STENT PLACEMENT (Left )   Anesthesia type: General   Pre-op diagnosis: left renal calculus   Location: [facility] OR ROOM 10 / [facility]   Surgeons: Abbie Sons, MD    Notes: 1. Patient has NO documented allergy to PCN   2. Urine culture on 07/19/2020 grew out > 100,000 CFU/ml Enterococcus faecalis . Rx for amoxicillin 875 mg BID x 10 days sent in on 07/22/2020  Request:  As an evidence based approach to reducing the rate of incidence for post-operative SSI and the development of MDROs, could an agent with narrower coverage for preoperative prophylaxis in this patient's upcoming surgical course be considered?  1. Currently ordered preoperative prophylactic ABX: ciprofloxacin.   2. Specifically requesting change to cephalosporin (CEFAZOLIN).   3. Please communicate decision with me and I will change the orders in Epic as per your direction.   Honor Loh, MSN, APRN, FNP-C, CEN [facility]  Peri-operative Services Nurse Practitioner 07/25/20 3:07 PM

## 2020-07-26 ENCOUNTER — Other Ambulatory Visit: Payer: Self-pay

## 2020-07-26 ENCOUNTER — Other Ambulatory Visit
Admission: RE | Admit: 2020-07-26 | Discharge: 2020-07-26 | Disposition: A | Discharge status: Home / Self Care | Payer: 59 | Source: Ambulatory Visit | Attending: Urology | Admitting: Urology

## 2020-07-26 DIAGNOSIS — Z01812 Encounter for preprocedural laboratory examination: Secondary | ICD-10-CM | POA: Insufficient documentation

## 2020-07-26 DIAGNOSIS — Z20822 Contact with and (suspected) exposure to covid-19: Secondary | ICD-10-CM | POA: Diagnosis not present

## 2020-07-27 LAB — SARS CORONAVIRUS 2 (TAT 6-24 HRS): SARS Coronavirus 2: NEGATIVE

## 2020-07-30 ENCOUNTER — Ambulatory Visit: Payer: 59 | Admitting: Urgent Care

## 2020-07-30 ENCOUNTER — Ambulatory Visit: Payer: 59

## 2020-07-30 ENCOUNTER — Other Ambulatory Visit: Payer: Self-pay

## 2020-07-30 ENCOUNTER — Other Ambulatory Visit: Payer: Self-pay | Admitting: Family Medicine

## 2020-07-30 ENCOUNTER — Encounter
Admission: RE | Disposition: A | Discharge status: Home / Self Care | Payer: Self-pay | Source: Ambulatory Visit | Attending: Urology

## 2020-07-30 ENCOUNTER — Encounter: Payer: Self-pay | Admitting: Urology

## 2020-07-30 ENCOUNTER — Ambulatory Visit
Admission: RE | Admit: 2020-07-30 | Discharge: 2020-07-30 | Disposition: A | Discharge status: Home / Self Care | Payer: 59 | Source: Ambulatory Visit | Attending: Urology | Admitting: Urology

## 2020-07-30 DIAGNOSIS — N132 Hydronephrosis with renal and ureteral calculous obstruction: Secondary | ICD-10-CM | POA: Insufficient documentation

## 2020-07-30 DIAGNOSIS — Z8249 Family history of ischemic heart disease and other diseases of the circulatory system: Secondary | ICD-10-CM | POA: Insufficient documentation

## 2020-07-30 DIAGNOSIS — Z79899 Other long term (current) drug therapy: Secondary | ICD-10-CM | POA: Insufficient documentation

## 2020-07-30 DIAGNOSIS — Z800 Family history of malignant neoplasm of digestive organs: Secondary | ICD-10-CM | POA: Diagnosis not present

## 2020-07-30 DIAGNOSIS — K760 Fatty (change of) liver, not elsewhere classified: Secondary | ICD-10-CM | POA: Insufficient documentation

## 2020-07-30 DIAGNOSIS — Z7984 Long term (current) use of oral hypoglycemic drugs: Secondary | ICD-10-CM | POA: Diagnosis not present

## 2020-07-30 DIAGNOSIS — N200 Calculus of kidney: Secondary | ICD-10-CM

## 2020-07-30 DIAGNOSIS — G4730 Sleep apnea, unspecified: Secondary | ICD-10-CM | POA: Insufficient documentation

## 2020-07-30 DIAGNOSIS — N201 Calculus of ureter: Secondary | ICD-10-CM | POA: Diagnosis present

## 2020-07-30 DIAGNOSIS — F17210 Nicotine dependence, cigarettes, uncomplicated: Secondary | ICD-10-CM | POA: Diagnosis not present

## 2020-07-30 HISTORY — PX: CYSTOSCOPY/URETEROSCOPY/HOLMIUM LASER/STENT PLACEMENT: SHX6546

## 2020-07-30 LAB — GLUCOSE, CAPILLARY
Glucose-Capillary: 142 mg/dL — ABNORMAL HIGH (ref 70–99)
Glucose-Capillary: 115 mg/dL — ABNORMAL HIGH (ref 70–99)

## 2020-07-30 SURGERY — CYSTOSCOPY/URETEROSCOPY/HOLMIUM LASER/STENT PLACEMENT
Anesthesia: General | Laterality: Left

## 2020-07-30 MED ORDER — FENTANYL CITRATE (PF) 100 MCG/2ML IJ SOLN
INTRAMUSCULAR | Status: AC
  Filled 2020-07-30: qty 2

## 2020-07-30 MED ORDER — PHENYLEPHRINE HCL (PRESSORS) 10 MG/ML IV SOLN
INTRAVENOUS | Status: DC | PRN
  Administered 2020-07-30: 100 ug via INTRAVENOUS

## 2020-07-30 MED ORDER — CIPROFLOXACIN IN D5W 400 MG/200ML IV SOLN
INTRAVENOUS | Status: AC
  Filled 2020-07-30: qty 200

## 2020-07-30 MED ORDER — ORAL CARE MOUTH RINSE: 15.0000 mL | Freq: Once | OROMUCOSAL | Status: AC

## 2020-07-30 MED ORDER — ACETAMINOPHEN 10 MG/ML IV SOLN
INTRAVENOUS | Status: AC
  Filled 2020-07-30: qty 100

## 2020-07-30 MED ORDER — ROCURONIUM BROMIDE 100 MG/10ML IV SOLN
INTRAVENOUS | Status: DC | PRN
  Administered 2020-07-30 (×2): 10 mg via INTRAVENOUS
  Administered 2020-07-30: 40 mg via INTRAVENOUS

## 2020-07-30 MED ORDER — FENTANYL CITRATE (PF) 100 MCG/2ML IJ SOLN
INTRAMUSCULAR | Status: DC | PRN
Start: 2020-07-30 — End: 2020-07-30
  Administered 2020-07-30 (×4): 50 ug via INTRAVENOUS

## 2020-07-30 MED ORDER — KETOROLAC TROMETHAMINE 30 MG/ML IJ SOLN
INTRAMUSCULAR | Status: AC
  Filled 2020-07-30: qty 1

## 2020-07-30 MED ORDER — CIPROFLOXACIN IN D5W 400 MG/200ML IV SOLN
400.0000 mg | INTRAVENOUS | Status: AC
  Administered 2020-07-30: 400 mg via INTRAVENOUS

## 2020-07-30 MED ORDER — SUCCINYLCHOLINE CHLORIDE 200 MG/10ML IV SOSY
PREFILLED_SYRINGE | INTRAVENOUS | Status: AC
  Filled 2020-07-30: qty 10

## 2020-07-30 MED ORDER — IOHEXOL 180 MG/ML  SOLN
INTRAMUSCULAR | Status: DC | PRN
  Administered 2020-07-30: 20 mL

## 2020-07-30 MED ORDER — ONDANSETRON HCL 4 MG/2ML IJ SOLN
INTRAMUSCULAR | Status: DC | PRN
  Administered 2020-07-30: 4 mg via INTRAVENOUS

## 2020-07-30 MED ORDER — PROPOFOL 10 MG/ML IV BOLUS
INTRAVENOUS | Status: AC
  Filled 2020-07-30: qty 20

## 2020-07-30 MED ORDER — FAMOTIDINE 20 MG PO TABS
ORAL_TABLET | ORAL | Status: AC
  Filled 2020-07-30: qty 1

## 2020-07-30 MED ORDER — SUGAMMADEX SODIUM 500 MG/5ML IV SOLN
INTRAVENOUS | Status: DC | PRN
  Administered 2020-07-30: 300 mg via INTRAVENOUS

## 2020-07-30 MED ORDER — CHLORHEXIDINE GLUCONATE 0.12 % MT SOLN
OROMUCOSAL | Status: AC
  Administered 2020-07-30: 15 mL via OROMUCOSAL
  Filled 2020-07-30: qty 15

## 2020-07-30 MED ORDER — FAMOTIDINE 20 MG PO TABS
20.0000 mg | ORAL_TABLET | Freq: Once | ORAL | Status: AC
  Administered 2020-07-30: 20 mg via ORAL

## 2020-07-30 MED ORDER — FENTANYL CITRATE (PF) 100 MCG/2ML IJ SOLN
INTRAMUSCULAR | Status: AC
Start: 2020-07-30 — End: ?
  Filled 2020-07-30: qty 2

## 2020-07-30 MED ORDER — CHLORHEXIDINE GLUCONATE 0.12 % MT SOLN: 15.0000 mL | Freq: Once | OROMUCOSAL | Status: AC

## 2020-07-30 MED ORDER — PROPOFOL 10 MG/ML IV BOLUS
INTRAVENOUS | Status: DC | PRN
  Administered 2020-07-30: 200 mg via INTRAVENOUS

## 2020-07-30 MED ORDER — ROCURONIUM BROMIDE 10 MG/ML (PF) SYRINGE
PREFILLED_SYRINGE | INTRAVENOUS | Status: AC
  Filled 2020-07-30: qty 10

## 2020-07-30 MED ORDER — ACETAMINOPHEN 10 MG/ML IV SOLN
INTRAVENOUS | Status: DC | PRN
  Administered 2020-07-30: 1000 mg via INTRAVENOUS

## 2020-07-30 MED ORDER — LIDOCAINE HCL (PF) 2 % IJ SOLN
INTRAMUSCULAR | Status: AC
  Filled 2020-07-30: qty 5

## 2020-07-30 MED ORDER — ONDANSETRON HCL 4 MG/2ML IJ SOLN
INTRAMUSCULAR | Status: AC
  Filled 2020-07-30: qty 2

## 2020-07-30 MED ORDER — MIDAZOLAM HCL 2 MG/2ML IJ SOLN
INTRAMUSCULAR | Status: DC | PRN
  Administered 2020-07-30: 2 mg via INTRAVENOUS

## 2020-07-30 MED ORDER — ALBUTEROL SULFATE HFA 108 (90 BASE) MCG/ACT IN AERS
INHALATION_SPRAY | RESPIRATORY_TRACT | Status: DC | PRN
  Administered 2020-07-30: 6 via RESPIRATORY_TRACT

## 2020-07-30 MED ORDER — MIDAZOLAM HCL 2 MG/2ML IJ SOLN
INTRAMUSCULAR | Status: AC
  Filled 2020-07-30: qty 2

## 2020-07-30 MED ORDER — SODIUM CHLORIDE 0.9 % IV SOLN: INTRAVENOUS | Status: DC

## 2020-07-30 MED ORDER — SUCCINYLCHOLINE CHLORIDE 20 MG/ML IJ SOLN
INTRAMUSCULAR | Status: DC | PRN
  Administered 2020-07-30: 140 mg via INTRAVENOUS

## 2020-07-30 MED ORDER — SUGAMMADEX SODIUM 500 MG/5ML IV SOLN
INTRAVENOUS | Status: AC
  Filled 2020-07-30: qty 5

## 2020-07-30 MED ORDER — LIDOCAINE HCL (CARDIAC) PF 100 MG/5ML IV SOSY
PREFILLED_SYRINGE | INTRAVENOUS | Status: DC | PRN
  Administered 2020-07-30: 60 mg via INTRAVENOUS

## 2020-07-30 SURGICAL SUPPLY — 34 items
BAG DRAIN CYSTO-URO LG1000N (MISCELLANEOUS) ×2 IMPLANT
BASKET ZERO TIP 1.9FR (BASKET) ×2 IMPLANT
BRUSH SCRUB EZ 1% IODOPHOR (MISCELLANEOUS) ×2 IMPLANT
BSKT STON RTRVL ZERO TP 1.9FR (BASKET) ×2
CATH URETL 5X70 OPEN END (CATHETERS) IMPLANT
CNTNR SPEC 2.5X3XGRAD LEK (MISCELLANEOUS) ×1
CONT SPEC 4OZ STER OR WHT (MISCELLANEOUS) ×1
CONT SPEC 4OZ STRL OR WHT (MISCELLANEOUS) ×1
CONTAINER SPEC 2.5X3XGRAD LEK (MISCELLANEOUS) IMPLANT
DRAPE UTILITY 15X26 TOWEL STRL (DRAPES) ×2 IMPLANT
GLOVE BIOGEL PI IND STRL 7.5 (GLOVE) ×1 IMPLANT
GLOVE BIOGEL PI INDICATOR 7.5 (GLOVE) ×3
GOWN STRL REUS W/ TWL LRG LVL3 (GOWN DISPOSABLE) ×1 IMPLANT
GOWN STRL REUS W/ TWL XL LVL3 (GOWN DISPOSABLE) ×1 IMPLANT
GOWN STRL REUS W/TWL LRG LVL3 (GOWN DISPOSABLE) ×2
GOWN STRL REUS W/TWL XL LVL3 (GOWN DISPOSABLE) ×2
GUIDEWIRE STR DUAL SENSOR (WIRE) ×2 IMPLANT
INFUSOR MANOMETER BAG 3000ML (MISCELLANEOUS) ×2 IMPLANT
INTRODUCER DILATOR DOUBLE (INTRODUCER) IMPLANT
KIT TURNOVER CYSTO (KITS) ×2 IMPLANT
PACK CYSTO AR (MISCELLANEOUS) ×2 IMPLANT
SET CYSTO W/LG BORE CLAMP LF (SET/KITS/TRAYS/PACK) ×2 IMPLANT
SHEATH URETERAL 12FRX35CM (MISCELLANEOUS) IMPLANT
SOL .9 NS 3000ML IRR  AL (IV SOLUTION) ×1
SOL .9 NS 3000ML IRR AL (IV SOLUTION) ×1
SOL .9 NS 3000ML IRR UROMATIC (IV SOLUTION) ×1 IMPLANT
STENT URET 6FRX24 CONTOUR (STENTS) IMPLANT
STENT URET 6FRX26 CONTOUR (STENTS) IMPLANT
STENT URET 6FRX28 CONTOUR (STENTS) ×1 IMPLANT
SURGILUBE 2OZ TUBE FLIPTOP (MISCELLANEOUS) ×2 IMPLANT
TRACTIP FLEXIVA PULS ID 200XHI (Laser) ×1 IMPLANT
TRACTIP FLEXIVA PULSE ID 200 (Laser) ×2
VALVE UROSEAL ADJ ENDO (VALVE) ×1 IMPLANT
WATER STERILE IRR 1000ML POUR (IV SOLUTION) ×2 IMPLANT

## 2020-07-30 NOTE — Transfer of Care (Signed)
Immediate Anesthesia Transfer of Care Note  Patient: Dillon Bolton.  Procedure(s) Performed: CYSTOSCOPY/URETEROSCOPY/HOLMIUM LASER/STENT EXCHANGE (Left )  Patient Location: PACU  Anesthesia Type:General  Level of Consciousness: awake, alert  and oriented  Airway & Oxygen Therapy: Patient Spontanous Breathing and Patient connected to face mask oxygen  Post-op Assessment: Report given to RN and Post -op Vital signs reviewed and stable  Post vital signs: Reviewed and stable  Last Vitals:  Vitals Value Taken Time  BP 132/70 07/30/20 1609  Temp 36.3 C 07/30/20 1605  Pulse 91 07/30/20 1610  Resp 18 07/30/20 1610  SpO2 96 % 07/30/20 1610  Vitals shown include unvalidated device data.  Last Pain:  Vitals:   07/30/20 1605  TempSrc:   PainSc: 0-No pain         Complications: No complications documented.

## 2020-07-30 NOTE — Discharge Instructions (Addendum)
DISCHARGE INSTRUCTIONS FOR KIDNEY STONE/URETERAL STENT   MEDICATIONS:  1. Resume all your other meds from home.  2.  AZO (over-the-counter) can help with the burning/stinging when you urinate. 3.  You may resume your pain medication as needed and tamsulosin/oxybutynin for stent irritation.  ACTIVITY:  1. May resume regular activities in 24 hours. 2. No driving while on narcotic pain medications  3. Drink plenty of water  4. Continue to walk at home - you can still get blood clots when you are at home, so keep active, but don't over do it.  5. May return to work/school tomorrow or when you feel ready   SIGNS/SYMPTOMS TO CALL:  Please call us if you have a fever greater than 101.5, uncontrolled nausea/vomiting, uncontrolled pain, dizziness, unable to urinate, excessively bloody urine, chest pain, shortness of breath, leg swelling, leg pain, or any other concerns or questions.   Common postoperative symptoms include urinary frequency, urgency, blood in the urine and bladder spasms  You can reach [place] at [phone].   FOLLOW-UP:  1. You have an appointment for stent removal on 08/07/2020 at 2:00. Please arrive at 1:45.   AMBULATORY SURGERY  DISCHARGE INSTRUCTIONS   1) The drugs that you were given will stay in your system until tomorrow so for the next 24 hours you should not:  A) Drive an automobile B) Make any legal decisions C) Drink any alcoholic beverage   2) You may resume regular meals tomorrow.  Today it is better to start with liquids and gradually work up to solid foods.  You may eat anything you prefer, but it is better to start with liquids, then soup and crackers, and gradually work up to solid foods.   3) Please notify your doctor immediately if you have any unusual bleeding, trouble breathing, redness and pain at the surgery site, drainage, fever, or pain not relieved by medication.    4) Additional Instructions:        Please contact your physician  with any problems or Same Day Surgery at [phone], Monday through Friday 6 am to 4 pm, or [place] at [facility] number at [phone].

## 2020-07-30 NOTE — Anesthesia Preprocedure Evaluation (Signed)
Anesthesia Evaluation  Patient identified by MRN, date of birth, ID band Patient awake    Reviewed: Allergy & Precautions, NPO status , Patient's Chart, lab work & pertinent test results  History of Anesthesia Complications Negative for: history of anesthetic complications  Airway Mallampati: III  TM Distance: <3 FB Neck ROM: Full    Dental no notable dental hx. (+) Teeth Intact   Pulmonary sleep apnea and Continuous Positive Airway Pressure Ventilation , neg COPD, Current SmokerPatient did not abstain from smoking.,    breath sounds clear to auscultation- rhonchi (-) wheezing      Cardiovascular hypertension, Pt. on medications (-) CAD, (-) Past MI, (-) Cardiac Stents and (-) CABG  Rhythm:Regular Rate:Normal - Systolic murmurs and - Diastolic murmurs    Neuro/Psych neg Seizures negative neurological ROS  negative psych ROS   GI/Hepatic negative GI ROS, Neg liver ROS,   Endo/Other  diabetes, Oral Hypoglycemic AgentsMorbid obesity  Renal/GU negative Renal ROS     Musculoskeletal negative musculoskeletal ROS (+)   Abdominal (+) + obese,   Peds  Hematology negative hematology ROS (+)   Anesthesia Other Findings Past Medical History: No date: Diabetes mellitus without complication (HCC) No date: Sleep apnea   Reproductive/Obstetrics                             Anesthesia Physical  Anesthesia Plan  ASA: III  Anesthesia Plan: General   Post-op Pain Management:    Induction: Intravenous  PONV Risk Score and Plan: 1 and Ondansetron, Dexamethasone and Midazolam  Airway Management Planned: Oral ETT  Additional Equipment: None  Intra-op Plan:   Post-operative Plan: Extubation in OR  Informed Consent: I have reviewed the patients History and Physical, chart, labs and discussed the procedure including the risks, benefits and alternatives for the proposed anesthesia with the patient or  authorized representative who has indicated his/her understanding and acceptance.     Dental advisory given  Plan Discussed with: CRNA and Anesthesiologist  Anesthesia Plan Comments: (Discussed risks of anesthesia with patient, including PONV, sore throat, lip/dental damage. Rare risks discussed as well, such as cardiorespiratory and neurological sequelae. Patient understands. Patient informed about increased incidence of above perioperative risk due to high BMI. Patient understands. )        Anesthesia Quick Evaluation

## 2020-07-30 NOTE — Op Note (Signed)
Preoperative diagnosis: Left nephrolithiasis   Postoperative diagnosis: Left proximal ureteral calculus   Procedure:  1. Cystoscopy 2. Left ureteroscopy and stone removal 3. Ureteroscopic laser lithotripsy 4. Left ureteral stent placement (6FR/28 cm) 5. Left retrograde pyelography with interpretation  Surgeon: Nicki Reaper C. Lerline Valdivia, M.D.  Anesthesia: General  Complications: None  Intraoperative findings:  1.  Cystoscopy- urethra normal in caliber without stricture; prostate with mild lateral lobe enlargement and moderate bladder neck elevation; inflammatory changes left hemitrigone secondary to indwelling stent; glucose without erythema, solid or papillary lesions 2.  Ureteroscopy- calculus migrated to proximal ureter with marked inflammatory changes of the proximal ureter 3. Left retrograde pyelography post procedure showed no filling defects, stone fragments or contrast extravasation  EBL: Minimal  Specimens: 1. Calculus fragments for analysis   Indication: Dillon Bolton. is a 54 y.o. male status post cystoscopy with placement left ureteral stent for 07/19/2020 for presumed UTI with findings of pyonephrosis although patient never developed a symptomatic infection.  He has been treated with culture sensitive antibiotics and presents today for definitive stone treatment.   After reviewing the management options for treatment, the patient elected to proceed with the above surgical procedure(s). We have discussed the potential benefits and risks of the procedure, side effects of the proposed treatment, the likelihood of the patient achieving the goals of the procedure, and any potential problems that might occur during the procedure or recuperation. Informed consent has been obtained.  Description of procedure:  The patient was taken to the operating room and general anesthesia was induced.  The patient was placed in the dorsal lithotomy position, prepped and draped in the usual  sterile fashion, and preoperative antibiotics were administered. A preoperative time-out was performed.   A 21 French cystoscope was lubricated and passed under direct vision and advanced proximally into the bladder with findings as described above.    Attention was directed to the left ureteral orifice and the stent was grasped with endoscopic forceps and brought out to the urethral meatus.  A 0.038 Sensor wire was then advanced through the stent and into the renal pelvis under fluoroscopic guidance.    A single channel digital flexible ureteroscope was passed per urethra and the flexible ureteroscope was easily advanced into the left ureteral orifice alongside the guidewire and advanced proximally.  The distal mid and lower proximal ureter was normal in appearance.  The calculus was identified in the upper proximal ureter just distal to the UPJ with findings as described above.  A 200 micron holmium laser fiber was placed through the ureteroscope and the stone was dusted at a setting of 0.4 J and frequency of 40 hz.   Once down to a size of approximately 5 mm there was slight proximal migration of the calculus towards the renal pelvis.  The calculus was placed in a 1.9 French nitinol basket and was able to be brought to the upper distal ureter.  The basket was disassembled and the ureteroscope was removed.  A 4.5 French semirigid ureteroscope was then passed per urethra and into the distal ureter alongside the guidewire.  The calculus was further fragmented with the holmium laser.  The disassembled basket was removed and the remaining stone fragments were removed with a intact 1.9 [place] nitinol basket.  The ureteroscope was repassed and no significant sized fragments were identified.  Retrograde pyelogram was performed through the ureteroscope with findings as described above  A 6 FR/28 CM Contour ureteral stent was placed under fluoroscopic guidance.  The  wire was then removed with an adequate  stent curl noted in an upper pole calyx as well as in the bladder.  The bladder was then emptied and the procedure ended.  The patient appeared to tolerate the procedure well and without complications.  After anesthetic reversal the patient was transported to the PACU in stable condition.   Plan: He will follow up in the office on 08/07/2020 for cystoscopy with stent removal.  Dillon Giovanni, MD

## 2020-07-30 NOTE — Interval H&P Note (Signed)
History and Physical Interval Note:  Status post cystoscopy with placement left ureteral stent for 07/19/2020 for presumed UTI with findings of pyonephrosis although patient never developed a symptomatic infection.  He has been treated with culture sensitive antibiotics and presents today for definitive stone treatment.  The procedure was discussed in detail.  All questions were answered and he desires to proceed.  The low likelihood of inability to remove the stone secondary to inability to access the renal pelvis with the ureteroscope was discussed.  07/30/2020 2:00 PM  [place].  has presented today for surgery, with the diagnosis of left renal calculus.  The various methods of treatment have been discussed with the patient and family. After consideration of risks, benefits and other options for treatment, the patient has consented to  Procedure(s): CYSTOSCOPY/URETEROSCOPY/HOLMIUM LASER/STENT PLACEMENT (Left) as a surgical intervention.  The patient's history has been reviewed, patient examined, no change in status, stable for surgery.  I have reviewed the patient's chart and labs.  Questions were answered to the patient's satisfaction.     [place]

## 2020-07-30 NOTE — Telephone Encounter (Signed)
Requested Prescriptions  Pending Prescriptions Disp Refills  . JARDIANCE 10 MG TABS tablet [Pharmacy Med Name: JARDIANCE 10 MG TABLET] 30 tablet 3    Sig: TAKE 1 TABLET (10 MG TOTAL) BY MOUTH DAILY BEFORE BREAKFAST.     Endocrinology:  Diabetes - SGLT2 Inhibitors Failed - 07/30/2020  1:36 AM      Failed - LDL in normal range and within 360 days    LDL Chol Calc (NIH)  Date Value Ref Range Status  09/28/2019 76 0 - 99 mg/dL Final         Passed - Cr in normal range and within 360 days    Creatinine, Ser  Date Value Ref Range Status  07/18/2020 0.91 0.61 - 1.24 mg/dL Final         Passed - HBA1C is between 0 and 7.9 and within 180 days    Hemoglobin A1C  Date Value Ref Range Status  07/23/2020 6.4 (A) 4.0 - 5.6 % Final   Hgb A1c MFr Bld  Date Value Ref Range Status  09/28/2019 6.8 (H) 4.8 - 5.6 % Final    Comment:             Prediabetes: 5.7 - 6.4          Diabetes: >6.4          Glycemic control for adults with diabetes: <7.0          Passed - eGFR in normal range and within 360 days    GFR calc Af Amer  Date Value Ref Range Status  09/28/2019 124 >59 mL/min/1.73 Final   GFR calc non Af Amer  Date Value Ref Range Status  07/18/2020 >60 >60 mL/min Final         Passed - Valid encounter within last 6 months    Recent Outpatient Visits          1 week ago Hypertension associated with diabetes [facility])   [place], Dionne Bucy, MD   4 months ago Hypertension associated with diabetes [facility])   [facility], Dionne Bucy, MD   10 months ago Encounter for annual physical exam   [facility], Dionne Bucy, MD   1 year ago Need for influenza vaccination   [facility], Dionne Bucy, [place]   1 year ago Hyperlipidemia associated with type 2 diabetes mellitus [facility])   [place], Dionne Bucy, MD      Future Appointments            In 3 months Bacigalupo, Dionne Bucy, MD  [facility], [place]

## 2020-07-30 NOTE — Anesthesia Postprocedure Evaluation (Signed)
Anesthesia Post Note  Patient: Dillon Bolton.  Procedure(s) Performed: CYSTOSCOPY/URETEROSCOPY/HOLMIUM LASER/STENT EXCHANGE (Left )  Patient location during evaluation: PACU Anesthesia Type: General Level of consciousness: awake and alert Pain management: pain level controlled Vital Signs Assessment: post-procedure vital signs reviewed and stable Respiratory status: spontaneous breathing and respiratory function stable Cardiovascular status: stable Anesthetic complications: no   No complications documented.   Last Vitals:  Vitals:   07/30/20 1636 07/30/20 1639  BP: 133/68 129/69  Pulse: 76 70  Resp: 13 10  Temp: (!) 36.1 C   SpO2: 97% 94%    Last Pain:  Vitals:   07/30/20 1636  TempSrc: Temporal  PainSc: 0-No pain                 [name] K

## 2020-07-30 NOTE — Anesthesia Procedure Notes (Signed)
Procedure Name: Intubation Date/Time: 07/30/2020 2:42 PM Performed by: Debe Coder, CRNA Pre-anesthesia Checklist: Patient identified, Emergency Drugs available, Suction available and Patient being monitored Patient Re-evaluated:Patient Re-evaluated prior to induction Oxygen Delivery Method: Circle system utilized Preoxygenation: Pre-oxygenation with 100% oxygen Induction Type: IV induction Ventilation: Mask ventilation without difficulty Laryngoscope Size: McGraph and 4 Grade View: Grade I Tube type: Oral Tube size: 8.0 mm Number of attempts: 1 Airway Equipment and Method: Stylet and Oral airway Placement Confirmation: ETT inserted through vocal cords under direct vision,  positive ETCO2 and breath sounds checked- equal and bilateral Secured at: 24 cm Tube secured with: Tape Dental Injury: Teeth and Oropharynx as per pre-operative assessment

## 2020-07-31 ENCOUNTER — Encounter: Payer: Self-pay | Admitting: Urology

## 2020-07-31 ENCOUNTER — Telehealth: Payer: Self-pay | Admitting: Urology

## 2020-07-31 MED ORDER — OXYBUTYNIN CHLORIDE 5 MG PO TABS
ORAL_TABLET | ORAL | 0 refills | Status: DC
Start: 2020-07-31 — End: 2021-01-02

## 2020-07-31 MED ORDER — OXYCODONE-ACETAMINOPHEN 5-325 MG PO TABS: 1.0000 | ORAL_TABLET | ORAL | 0 refills | Status: DC | PRN

## 2020-07-31 NOTE — Telephone Encounter (Signed)
Patient had surgery with Dr. Bernardo Heater yesterday.  He called the office this morning requesting a prescription for oxybutynin and oxycodone to be [facility] to [facility] on [address].

## 2020-08-03 LAB — CALCULI, WITH PHOTOGRAPH (CLINICAL LAB)
Calcium Oxalate Dihydrate: 10 %
Calcium Oxalate Monohydrate: 90 %
Weight Calculi: 16 mg

## 2020-08-07 ENCOUNTER — Encounter: Payer: 59 | Admitting: Urology

## 2020-08-09 ENCOUNTER — Ambulatory Visit (INDEPENDENT_AMBULATORY_CARE_PROVIDER_SITE_OTHER): Payer: 59 | Admitting: Urology

## 2020-08-09 ENCOUNTER — Other Ambulatory Visit: Payer: Self-pay

## 2020-08-09 ENCOUNTER — Encounter: Payer: Self-pay | Admitting: Urology

## 2020-08-09 VITALS — BP 122/66 | HR 90 | Ht 73.0 in | Wt 308.0 lb

## 2020-08-09 DIAGNOSIS — N200 Calculus of kidney: Secondary | ICD-10-CM

## 2020-08-09 LAB — URINALYSIS, COMPLETE
Bilirubin, UA: NEGATIVE
Ketones, UA: NEGATIVE
Nitrite, UA: NEGATIVE
Specific Gravity, UA: 1.01 (ref 1.005–1.030)
Urobilinogen, Ur: 0.2 mg/dL (ref 0.2–1.0)
pH, UA: 5.5 (ref 5.0–7.5)

## 2020-08-09 LAB — MICROSCOPIC EXAMINATION
Bacteria, UA: NONE SEEN
Epithelial Cells (non renal): NONE SEEN /hpf (ref 0–10)

## 2020-08-09 MED ORDER — CIPROFLOXACIN HCL 500 MG PO TABS
500.0000 mg | ORAL_TABLET | Freq: Once | ORAL | Status: AC
  Administered 2020-08-09: 500 mg via ORAL

## 2020-08-09 NOTE — Patient Instructions (Signed)

## 2020-08-09 NOTE — Progress Notes (Signed)
Indications: Patient is 54 y.o., who is s/p ureteroscopic removal of a 10 mm left proximal ureteral calculus on 07/30/2020.  Moderate stent symptoms otherwise no complaints.  The patient is presenting today for stent removal.  Procedure:  Flexible Cystoscopy with stent removal (84069)  Timeout was performed and the correct patient, procedure and participants were identified.    Description:  The patient was prepped and draped in the usual sterile fashion. Flexible cystosopy was performed.  The stent was visualized, grasped, and removed intact without difficulty. The patient tolerated the procedure well.  A single dose of oral antibiotics was given.  Complications:  None  Plan:   Instructed to call for flank pain/fever after stent removal  Recurrent stone former with small right renal calculi on CT and have recommended metabolic evaluation to include blood work and a 24-hour urine study through [employer] 90% / 10% CaOxM/CaOxD

## 2020-10-03 ENCOUNTER — Other Ambulatory Visit: Payer: Self-pay | Admitting: Family Medicine

## 2020-10-27 ENCOUNTER — Other Ambulatory Visit: Payer: Self-pay | Admitting: Family Medicine

## 2020-10-29 ENCOUNTER — Encounter: Payer: 59 | Admitting: Family Medicine

## 2020-11-21 ENCOUNTER — Other Ambulatory Visit: Payer: Self-pay | Admitting: Family Medicine

## 2020-11-21 NOTE — Telephone Encounter (Signed)
Requested medications are due for refill today yes  Requested medications are on the active medication list yes  Last refill 1/17 (one month supply)  Future visit scheduled 01/02/21  Notes to clinic Already had a curtesy refill for one month supply, upcoming appt is not until 01/02/21, please assess.

## 2020-12-14 ENCOUNTER — Other Ambulatory Visit: Payer: Self-pay | Admitting: Family Medicine

## 2020-12-14 NOTE — Telephone Encounter (Signed)
Requested Prescriptions  Pending Prescriptions Disp Refills   metFORMIN (GLUCOPHAGE) 1000 MG tablet [Pharmacy Med Name: METFORMIN HCL 1,000 MG TABLET] 60 tablet 0    Sig: TAKE 1 TABLET (1,000 MG TOTAL) BY MOUTH 2 (TWO) TIMES DAILY WITH A MEAL.     Endocrinology:  Diabetes - Biguanides Passed - 12/14/2020  9:04 AM      Passed - Cr in normal range and within 360 days    Creatinine, Ser  Date Value Ref Range Status  07/18/2020 0.91 0.61 - 1.24 mg/dL Final         Passed - HBA1C is between 0 and 7.9 and within 180 days    Hemoglobin A1C  Date Value Ref Range Status  07/23/2020 6.4 (A) 4.0 - 5.6 % Final   Hgb A1c MFr Bld  Date Value Ref Range Status  09/28/2019 6.8 (H) 4.8 - 5.6 % Final    Comment:             Prediabetes: 5.7 - 6.4          Diabetes: >6.4          Glycemic control for adults with diabetes: <7.0          Passed - eGFR in normal range and within 360 days    GFR calc Af Amer  Date Value Ref Range Status  09/28/2019 124 >59 mL/min/1.73 Final   GFR calc non Af Amer  Date Value Ref Range Status  07/18/2020 >60 >60 mL/min Final         Passed - Valid encounter within last 6 months    Recent Outpatient Visits          4 months ago Hypertension associated with diabetes [facility])   [facility], Dionne Bucy, MD   8 months ago Hypertension associated with diabetes [facility])   [facility], Dionne Bucy, MD   1 year ago Encounter for annual physical exam   [facility], Dionne Bucy, MD   1 year ago Need for influenza vaccination   [facility] [place], Dionne Bucy, MD   1 year ago Hyperlipidemia associated with type 2 diabetes mellitus [facility])   [place], Dionne Bucy, MD      Future Appointments            In 2 weeks Bacigalupo, Dionne Bucy, MD [facility], [place]

## 2021-01-02 ENCOUNTER — Ambulatory Visit (INDEPENDENT_AMBULATORY_CARE_PROVIDER_SITE_OTHER): Payer: 59 | Admitting: Family Medicine

## 2021-01-02 ENCOUNTER — Encounter: Payer: Self-pay | Admitting: Family Medicine

## 2021-01-02 ENCOUNTER — Other Ambulatory Visit: Payer: Self-pay

## 2021-01-02 VITALS — BP 119/57 | HR 73 | Temp 97.7°F | Resp 16 | Ht 73.0 in | Wt 328.3 lb

## 2021-01-02 DIAGNOSIS — F17200 Nicotine dependence, unspecified, uncomplicated: Secondary | ICD-10-CM

## 2021-01-02 DIAGNOSIS — E785 Hyperlipidemia, unspecified: Secondary | ICD-10-CM

## 2021-01-02 DIAGNOSIS — Z0000 Encounter for general adult medical examination without abnormal findings: Secondary | ICD-10-CM

## 2021-01-02 DIAGNOSIS — D72829 Elevated white blood cell count, unspecified: Secondary | ICD-10-CM

## 2021-01-02 DIAGNOSIS — E1169 Type 2 diabetes mellitus with other specified complication: Secondary | ICD-10-CM

## 2021-01-02 DIAGNOSIS — Z23 Encounter for immunization: Secondary | ICD-10-CM

## 2021-01-02 DIAGNOSIS — I152 Hypertension secondary to endocrine disorders: Secondary | ICD-10-CM

## 2021-01-02 DIAGNOSIS — Z1159 Encounter for screening for other viral diseases: Secondary | ICD-10-CM

## 2021-01-02 DIAGNOSIS — Z6841 Body Mass Index (BMI) 40.0 and over, adult: Secondary | ICD-10-CM

## 2021-01-02 DIAGNOSIS — E1159 Type 2 diabetes mellitus with other circulatory complications: Secondary | ICD-10-CM | POA: Diagnosis not present

## 2021-01-02 LAB — POCT UA - MICROALBUMIN: Microalbumin Ur, POC: NEGATIVE mg/L

## 2021-01-02 NOTE — Assessment & Plan Note (Signed)
Encourage cessation. °

## 2021-01-02 NOTE — Assessment & Plan Note (Addendum)
Well controlled with last A1c <7 Associated with HTN and HLD Continue current medications Recheck a1c UTD on vaccines, foot exam Advised on need for eye exam Urine microalbumin today On Statin Discussed diet and exercise F/u in 6 months

## 2021-01-02 NOTE — Assessment & Plan Note (Signed)
Well controlled Continue current medications Recheck metabolic panel F/u in 6 months  

## 2021-01-02 NOTE — Progress Notes (Signed)
Complete physical exam   Patient: Dillon Bolton.   DOB: Jun 20, 1966   55 y.o. Male  MRN: [id number] Visit Date: 01/02/2021  Today's healthcare provider: Lavon Paganini, MD   Chief Complaint  Patient presents with  . Annual Exam   Subjective    Dillon Bolton. is a 55 y.o. male who presents today for a complete physical exam.  He reports consuming a general diet. The patient does not participate in regular exercise at present. He generally feels fairly well. He reports sleeping fairly well. He does not have additional problems to discuss today.  HPI  01/06/2018 Colonoscopy-polyps 01/06/2018 Pathology-Polyps, precancerous, repeat colonoscopy in 3 years - will call for appt  Past Medical History:  Diagnosis Date  . Diabetes mellitus without complication ([place])   . Sleep apnea    Past Surgical History:  Procedure Laterality Date  . COLONOSCOPY WITH PROPOFOL N/A 01/06/2018   Procedure: COLONOSCOPY WITH PROPOFOL;  Surgeon: Jonathon Bellows, MD;  Location: [facility];  Service: Gastroenterology;  Laterality: N/A;  . CYSTOSCOPY WITH STENT PLACEMENT Left 07/19/2020   Procedure: CYSTOSCOPY WITH STENT PLACEMENT;  Surgeon: Abbie Sons, MD;  Location: [facility] ORS;  Service: Urology;  Laterality: Left;  . CYSTOSCOPY/URETEROSCOPY/HOLMIUM LASER/STENT PLACEMENT Left 07/30/2020   Procedure: CYSTOSCOPY/URETEROSCOPY/HOLMIUM LASER/STENT EXCHANGE;  Surgeon: Abbie Sons, MD;  Location: [facility] ORS;  Service: Urology;  Laterality: Left;  . KNEE SURGERY Bilateral 1975   removed osteochondromas at age 56  . NOSE SURGERY     to help with OSA, opened up passages  . TONSILLECTOMY    . WISDOM TOOTH EXTRACTION     Social History   Socioeconomic History  . Marital status: Married    Spouse name: Elana  . Number of children: 3  . Years of education: 55  . Highest education level: Associate degree: academic program  Occupational History  . Occupation: works on [employer] fundraiser: [employer]    Comment: [employer]  Tobacco Use  . Smoking status: Current Every Day Smoker    Packs/day: 0.50    Types: Cigarettes    Start date: 23  . Smokeless tobacco: Never Used  . Tobacco comment: started smoking at age 61. Is smoking 0.25-0.75 PPD, which is decreased from 1.5-2 PPD  Vaping Use  . Vaping Use: Never used  Substance and Sexual Activity  . Alcohol use: Yes    Alcohol/week: 1.0 - 2.0 standard drink    Types: 1 - 2 Cans of beer per week    Comment: occasional  . Drug use: No  . Sexual activity: Yes    Partners: Female  Other Topics Concern  . Not on file  Social History Narrative   Pt has 3 adopted children.   Social Determinants of Health   Financial Resource Strain: Not on file  Food Insecurity: Not on file  Transportation Needs: Not on file  Physical Activity: Not on file  Stress: Not on file  Social Connections: Not on file  Intimate Partner Violence: Not on file   Family Status  Relation Name Status  . Mother  Alive  . Father  Alive  . Sister half sister Alive  . Brother half brother Alive  . Brother half brother Alive  . Neg Hx  (Not Specified)   Family History  Problem Relation Age of Onset  . Hypertension Mother   . Colon cancer Father   . Hypertension Brother   . Prostate cancer Neg Hx  No Known Allergies  Patient Care Team: Virginia Crews, MD as PCP - General (Family Medicine)   Medications: Outpatient Medications Prior to Visit  Medication Sig  . atorvastatin (LIPITOR) 40 MG tablet TAKE 1 TABLET BY MOUTH EVERY DAY (Patient taking differently: Take 40 mg by mouth daily.)  . JARDIANCE 10 MG TABS tablet TAKE 1 TABLET BY MOUTH DAILY BEFORE BREAKFAST.  . Meloxicam 15 MG TBDP Take 15 mg by mouth daily.   . metFORMIN (GLUCOPHAGE) 1000 MG tablet TAKE 1 TABLET (1,000 MG TOTAL) BY MOUTH 2 (TWO) TIMES DAILY WITH A MEAL.  . [DISCONTINUED] oxybutynin (DITROPAN) 5 MG tablet 1 tab tid prn frequency,urgency, bladder spasm  .  [DISCONTINUED] oxyCODONE-acetaminophen (PERCOCET) 5-325 MG tablet Take 1 tablet by mouth every 4 (four) hours as needed. (Patient not taking: Reported on 08/09/2020)  . [DISCONTINUED] tamsulosin (FLOMAX) 0.4 MG CAPS capsule Take 1 capsule (0.4 mg total) by mouth daily after breakfast. (Patient not taking: Reported on 08/09/2020)   No facility-administered medications prior to visit.    Review of Systems  All other systems reviewed and are negative.   Last CBC Lab Results  Component Value Date   WBC 17.7 (H) 07/18/2020   HGB 15.7 07/18/2020   HCT 46.9 07/18/2020   MCV 91.8 07/18/2020   MCH 30.7 07/18/2020   RDW 14.2 07/18/2020   PLT 197 76/19/5093   Last metabolic panel Lab Results  Component Value Date   GLUCOSE 138 (H) 07/18/2020   NA 138 07/18/2020   K 3.9 07/18/2020   CL 104 07/18/2020   CO2 23 07/18/2020   BUN 14 07/18/2020   CREATININE 0.91 07/18/2020   GFRNONAA >60 07/18/2020   GFRAA 124 09/28/2019   CALCIUM 9.0 07/18/2020   PROT 7.5 07/18/2020   ALBUMIN 4.2 07/18/2020   LABGLOB 2.7 09/28/2019   AGRATIO 1.5 09/28/2019   BILITOT 0.9 07/18/2020   ALKPHOS 65 07/18/2020   AST 18 07/18/2020   ALT 28 07/18/2020   ANIONGAP 11 07/18/2020   Last lipids Lab Results  Component Value Date   CHOL 130 09/28/2019   HDL 40 09/28/2019   LDLCALC 76 09/28/2019   TRIG 68 09/28/2019   CHOLHDL 3.3 09/28/2019   Last hemoglobin A1c Lab Results  Component Value Date   HGBA1C 6.4 (A) 07/23/2020   Last thyroid functions No results found for: TSH, T3TOTAL, T4TOTAL, THYROIDAB    Objective    BP (!) 119/57 (BP Location: Left Arm, Patient Position: Sitting, Cuff Size: Large)   Pulse 73   Temp 97.7 F (36.5 C) (Oral)   Resp 16   Ht 6\' 1"  (1.854 m)   Wt (!) 328 lb 4.8 oz (148.9 kg)   SpO2 99%   BMI 43.31 kg/m  BP Readings from Last 3 Encounters:  01/02/21 (!) 119/57  08/09/20 122/66  07/30/20 129/69   Wt Readings from Last 3 Encounters:  01/02/21 (!) 328 lb 4.8 oz  (148.9 kg)  08/09/20 (!) 308 lb (139.7 kg)  07/30/20 (!) 315 lb 4.1 oz (143 kg)      Physical Exam Vitals reviewed.  Constitutional:      General: He is not in acute distress.    Appearance: Normal appearance. He is well-developed. He is not diaphoretic.  HENT:     Head: Normocephalic and atraumatic.     Right Ear: Tympanic membrane, ear canal and external ear normal.     Left Ear: Tympanic membrane, ear canal and external ear normal.  Eyes:  General: No scleral icterus.    Conjunctiva/sclera: Conjunctivae normal.     Pupils: Pupils are equal, round, and reactive to light.  Neck:     Thyroid: No thyromegaly.  Cardiovascular:     Rate and Rhythm: Normal rate and regular rhythm.     Pulses: Normal pulses.     Heart sounds: Normal heart sounds. No murmur heard.   Pulmonary:     Effort: Pulmonary effort is normal. No respiratory distress.     Breath sounds: Normal breath sounds. No wheezing or rales.  Abdominal:     General: There is no distension.     Palpations: Abdomen is soft.     Tenderness: There is no abdominal tenderness.  Musculoskeletal:        General: No deformity.     Cervical back: Neck supple.     Right lower leg: No edema.     Left lower leg: No edema.  Lymphadenopathy:     Cervical: No cervical adenopathy.  Skin:    General: Skin is warm and dry.     Findings: No rash.  Neurological:     Mental Status: He is alert and oriented to person, place, and time. Mental status is at baseline.     Gait: Gait normal.  Psychiatric:        Mood and Affect: Mood normal.        Behavior: Behavior normal.        Thought Content: Thought content normal.       Last depression screening scores PHQ 2/9 Scores 01/02/2021 07/23/2020 09/28/2019  PHQ - 2 Score 0 1 0  PHQ- 9 Score 0 3 0   Last fall risk screening Fall Risk  01/02/2021  Falls in the past year? 0  Number falls in past yr: 0  Injury with Fall? 0  Risk for fall due to : No Fall Risks  Follow up  Falls evaluation completed   Last Audit-C alcohol use screening Alcohol Use Disorder Test (AUDIT) 01/02/2021  1. How often do you have a drink containing alcohol? 3  2. How many drinks containing alcohol do you have on a typical day when you are drinking? 0  3. How often do you have six or more drinks on one occasion? 0  AUDIT-C Score 3  4. How often during the last year have you found that you were not able to stop drinking once you had started? 0  5. How often during the last year have you failed to do what was normally expected from you because of drinking? 0  6. How often during the last year have you needed a first drink in the morning to get yourself going after a heavy drinking session? 0  7. How often during the last year have you had a feeling of guilt of remorse after drinking? 0  8. How often during the last year have you been unable to remember what happened the night before because you had been drinking? 0  9. Have you or someone else been injured as a result of your drinking? 0  10. Has a relative or friend or a doctor or another health worker been concerned about your drinking or suggested you cut down? 0  Alcohol Use Disorder Identification Test Final Score (AUDIT) 3  Alcohol Brief Interventions/Follow-up AUDIT Score <7 follow-up not indicated   A score of 3 or more in women, and 4 or more in men indicates increased risk for alcohol abuse, EXCEPT if all of  the points are from question 1   No results found for any visits on 01/02/21.  Assessment & Plan    Routine Health Maintenance and Physical Exam  Exercise Activities and Dietary recommendations Goals   None     Immunization History  Administered Date(s) Administered  . Influenza Inj Mdck Quad Pf 08/04/2018  . Influenza Split 08/04/2015  . Influenza,inj,Quad PF,6+ Mos 11/09/2017, 06/16/2019, 08/25/2020  . Moderna Sars-Covid-2 Vaccination 02/19/2020, 03/18/2020  . Pneumococcal Polysaccharide-23 05/17/2018  . Tdap  11/09/2017  . Zoster Recombinat (Shingrix) 09/28/2019    Health Maintenance  Topic Date Due  . Hepatitis C Screening  Never done  . OPHTHALMOLOGY EXAM  09/29/2019  . COVID-19 Vaccine (3 - Booster for Moderna series) 09/17/2020  . URINE MICROALBUMIN  10/02/2020  . COLONOSCOPY (Pts 45-68yrs Insurance coverage will need to be confirmed)  01/06/2021  . HEMOGLOBIN A1C  01/21/2021  . FOOT EXAM  07/23/2021  . TETANUS/TDAP  11/10/2027  . INFLUENZA VACCINE  Completed  . PNEUMOCOCCAL POLYSACCHARIDE VACCINE AGE 59-64 HIGH RISK  Completed  . HIV Screening  Completed  . HPV VACCINES  Aged Out    Discussed health benefits of physical activity, and encouraged him to engage in regular exercise appropriate for his age and condition.  Problem List Items Addressed This Visit      Cardiovascular and Mediastinum   Hypertension associated with diabetes ([place])    Well controlled Continue current medications Recheck metabolic panel F/u in 6 months       Relevant Orders   Comprehensive metabolic panel     Endocrine   Hyperlipidemia associated with type 2 diabetes mellitus ([place])    Previously well controlled Continue statin Repeat FLP and CMP Goal LDL < 70      Relevant Orders   Lipid Panel With LDL/HDL Ratio   T2DM (type 2 diabetes mellitus) ([place])    Well controlled with last A1c <7 Associated with HTN and HLD Continue current medications Recheck a1c UTD on vaccines, foot exam Advised on need for eye exam Urine microalbumin today On Statin Discussed diet and exercise F/u in 6 months       Relevant Orders   Hemoglobin A1c     Other   Morbid obesity ([place])    Discussed importance of healthy weight management Discussed diet and exercise       Relevant Orders   POCT UA - Microalbumin   Tobacco use disorder    Encourage cessation       Other Visit Diagnoses    Encounter for annual physical exam    -  Primary   Relevant Orders   Comprehensive metabolic panel   Hepatitis C  antibody   Lipid Panel With LDL/HDL Ratio   CBC w/Diff/Platelet   Encounter for hepatitis C screening test for low risk patient       Relevant Orders   Hepatitis C antibody   Leukocytosis, unspecified type       Relevant Orders   CBC w/Diff/Platelet   BMI 40.0-44.9, adult ([place])           Return in about 6 months (around 07/05/2021) for chronic disease f/u.     I, Lavon Paganini, MD, have reviewed all documentation for this visit. The documentation on 01/02/21 for the exam, diagnosis, procedures, and orders are all accurate and complete.   Bacigalupo, Dionne Bucy, MD, MPH [employer]

## 2021-01-02 NOTE — Assessment & Plan Note (Signed)
Previously well controlled Continue statin Repeat FLP and CMP Goal LDL < 70 

## 2021-01-02 NOTE — Patient Instructions (Signed)

## 2021-01-02 NOTE — Assessment & Plan Note (Signed)
Discussed importance of healthy weight management Discussed diet and exercise  

## 2021-01-03 LAB — COMPREHENSIVE METABOLIC PANEL
ALT: 31 IU/L (ref 0–44)
AST: 17 IU/L (ref 0–40)
Albumin/Globulin Ratio: 1.6 (ref 1.2–2.2)
Albumin: 4.2 g/dL (ref 3.8–4.9)
Alkaline Phosphatase: 88 IU/L (ref 44–121)
BUN/Creatinine Ratio: 13 (ref 9–20)
BUN: 9 mg/dL (ref 6–24)
Bilirubin Total: 0.6 mg/dL (ref 0.0–1.2)
CO2: 20 mmol/L (ref 20–29)
Calcium: 9.3 mg/dL (ref 8.7–10.2)
Chloride: 104 mmol/L (ref 96–106)
Creatinine, Ser: 0.71 mg/dL — ABNORMAL LOW (ref 0.76–1.27)
Globulin, Total: 2.6 g/dL (ref 1.5–4.5)
Glucose: 104 mg/dL — ABNORMAL HIGH (ref 65–99)
Potassium: 4.6 mmol/L (ref 3.5–5.2)
Sodium: 141 mmol/L (ref 134–144)
Total Protein: 6.8 g/dL (ref 6.0–8.5)
eGFR: 109 mL/min/{1.73_m2} (ref >59)

## 2021-01-03 LAB — CBC WITH DIFFERENTIAL/PLATELET
Basophils Absolute: 0.1 10*3/uL (ref 0.0–0.2)
Basos: 1 %
EOS (ABSOLUTE): 0.2 10*3/uL (ref 0.0–0.4)
Eos: 2 %
Hematocrit: 45.8 % (ref 37.5–51.0)
Hemoglobin: 15.6 g/dL (ref 13.0–17.7)
Immature Grans (Abs): 0 10*3/uL (ref 0.0–0.1)
Immature Granulocytes: 0 %
Lymphocytes Absolute: 2.9 10*3/uL (ref 0.7–3.1)
Lymphs: 32 %
MCH: 31.1 pg (ref 26.6–33.0)
MCHC: 34.1 g/dL (ref 31.5–35.7)
MCV: 91 fL (ref 79–97)
Monocytes Absolute: 1 10*3/uL — ABNORMAL HIGH (ref 0.1–0.9)
Monocytes: 11 %
Neutrophils Absolute: 4.9 10*3/uL (ref 1.4–7.0)
Neutrophils: 54 %
Platelets: 218 10*3/uL (ref 150–450)
RBC: 5.02 x10E6/uL (ref 4.14–5.80)
RDW: 13.1 % (ref 11.6–15.4)
WBC: 9.1 10*3/uL (ref 3.4–10.8)

## 2021-01-03 LAB — LIPID PANEL WITH LDL/HDL RATIO
Cholesterol, Total: 145 mg/dL (ref 100–199)
HDL: 36 mg/dL — ABNORMAL LOW (ref >39)
LDL Chol Calc (NIH): 90 mg/dL (ref 0–99)
LDL/HDL Ratio: 2.5 ratio (ref 0.0–3.6)
Triglycerides: 101 mg/dL (ref 0–149)
VLDL Cholesterol Cal: 19 mg/dL (ref 5–40)

## 2021-01-03 LAB — HEPATITIS C ANTIBODY: Hep C Virus Ab: <0.1 s/co ratio (ref 0.0–0.9)

## 2021-01-03 LAB — HEMOGLOBIN A1C
Est. average glucose Bld gHb Est-mCnc: 148 mg/dL
Hgb A1c MFr Bld: 6.8 % — ABNORMAL HIGH (ref 4.8–5.6)

## 2021-01-06 ENCOUNTER — Other Ambulatory Visit: Payer: Self-pay | Admitting: Family Medicine

## 2021-01-19 ENCOUNTER — Other Ambulatory Visit: Payer: Self-pay | Admitting: Family Medicine

## 2021-01-19 NOTE — Telephone Encounter (Signed)
Requested Prescriptions  Pending Prescriptions Disp Refills  . JARDIANCE 10 MG TABS tablet [Pharmacy Med Name: JARDIANCE 10 MG TABLET] 90 tablet 1    Sig: TAKE 1 TABLET BY MOUTH EVERY DAY BEFORE BREAKFAST     Endocrinology:  Diabetes - SGLT2 Inhibitors Failed - 01/19/2021  9:25 AM      Failed - Cr in normal range and within 360 days    Creatinine, Ser  Date Value Ref Range Status  01/02/2021 0.71 (L) 0.76 - 1.27 mg/dL Final         Failed - LDL in normal range and within 360 days    LDL Chol Calc (NIH)  Date Value Ref Range Status  01/02/2021 90 0 - 99 mg/dL Final         Passed - HBA1C is between 0 and 7.9 and within 180 days    Hgb A1c MFr Bld  Date Value Ref Range Status  01/02/2021 6.8 (H) 4.8 - 5.6 % Final    Comment:             Prediabetes: 5.7 - 6.4          Diabetes: >6.4          Glycemic control for adults with diabetes: <7.0          Passed - eGFR in normal range and within 360 days    GFR calc Af Amer  Date Value Ref Range Status  09/28/2019 124 >59 mL/min/1.73 Final   GFR calc non Af Amer  Date Value Ref Range Status  07/18/2020 >60 >60 mL/min Final         Passed - Valid encounter within last 6 months    Recent Outpatient Visits          2 weeks ago Encounter for annual physical exam   [facility] [place], Dionne Bucy, MD   6 months ago Hypertension associated with diabetes [facility])   [facility], Dionne Bucy, MD   9 months ago Hypertension associated with diabetes [facility])   [facility], Dionne Bucy, MD   1 year ago Encounter for annual physical exam   [facility], Dionne Bucy, MD   1 year ago Need for influenza vaccination   [facility] Bacigalupo, Dionne Bucy, MD      Future Appointments            In 6 months Bacigalupo, Dionne Bucy, MD [facility], [place]           . atorvastatin (LIPITOR) 40 MG tablet [Pharmacy Med Name: ATORVASTATIN 40  MG TABLET] 90 tablet 3    Sig: TAKE 1 TABLET BY MOUTH EVERY DAY     Cardiovascular:  Antilipid - Statins Failed - 01/19/2021  9:25 AM      Failed - LDL in normal range and within 360 days    LDL Chol Calc (NIH)  Date Value Ref Range Status  01/02/2021 90 0 - 99 mg/dL Final         Failed - HDL in normal range and within 360 days    HDL  Date Value Ref Range Status  01/02/2021 36 (L) >39 mg/dL Final         Passed - Total Cholesterol in normal range and within 360 days    Cholesterol, Total  Date Value Ref Range Status  01/02/2021 145 100 - 199 mg/dL Final         Passed - Triglycerides in normal  range and within 360 days    Triglycerides  Date Value Ref Range Status  01/02/2021 101 0 - 149 mg/dL Final         Passed - Patient is not pregnant      Passed - Valid encounter within last 12 months    Recent Outpatient Visits          2 weeks ago Encounter for annual physical exam   [facility] [place], Dionne Bucy, MD   6 months ago Hypertension associated with diabetes [facility])   [facility], Dionne Bucy, MD   9 months ago Hypertension associated with diabetes [facility])   [facility], Dionne Bucy, MD   1 year ago Encounter for annual physical exam   [facility] [place], Dionne Bucy, MD   1 year ago Need for influenza vaccination   [facility] Bacigalupo, Dionne Bucy, MD      Future Appointments            In 6 months Bacigalupo, Dionne Bucy, MD [facility], [place]

## 2021-02-24 IMAGING — CT CT RENAL STONE PROTOCOL
2 of 4 series · 16 of 46 positions shown, 18 images · non-contrast
Comparison: None.

CLINICAL DATA: Flank pain.

EXAM:
CT ABDOMEN AND PELVIS WITHOUT CONTRAST
TECHNIQUE: Multidetector CT imaging of the abdomen and pelvis was performed
following the standard protocol without IV contrast.

[Series 2: stone full standard · axial · 0.98mm/px · z∈[-1092,-632]mm · 13 of 101 slices shown, 15 images]
[im 5/101  soft-tissue]
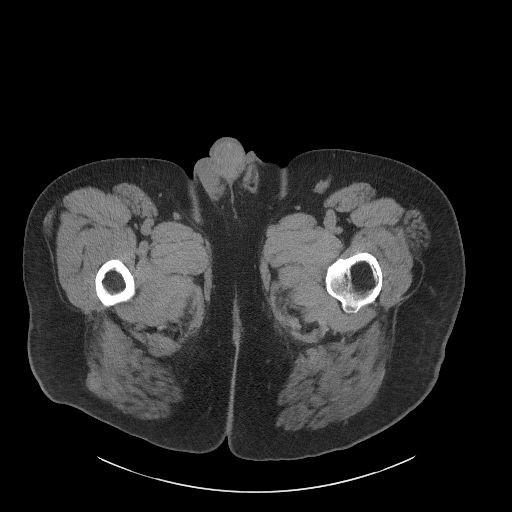
[im 5/101  bone]
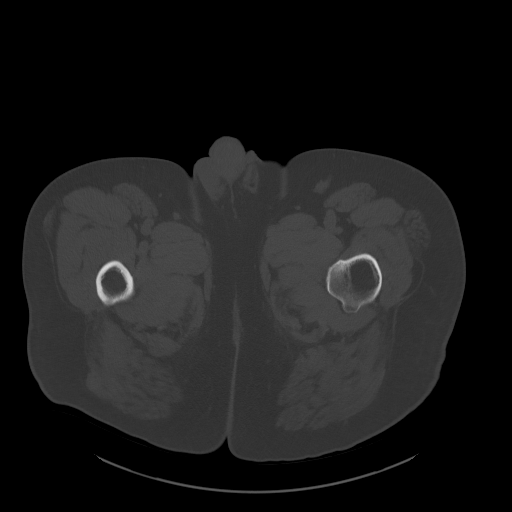
[im 13/101  soft-tissue]
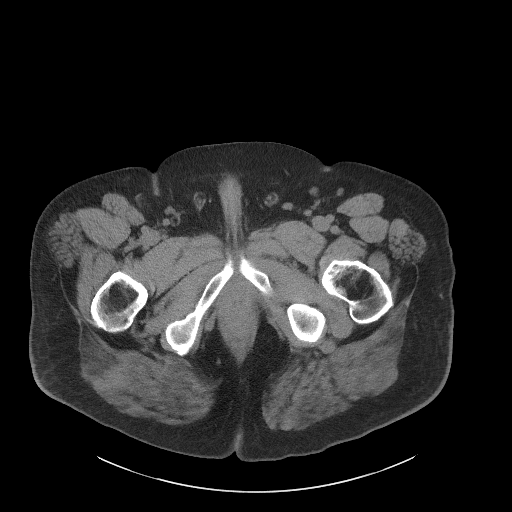
[im 21/101  soft-tissue]
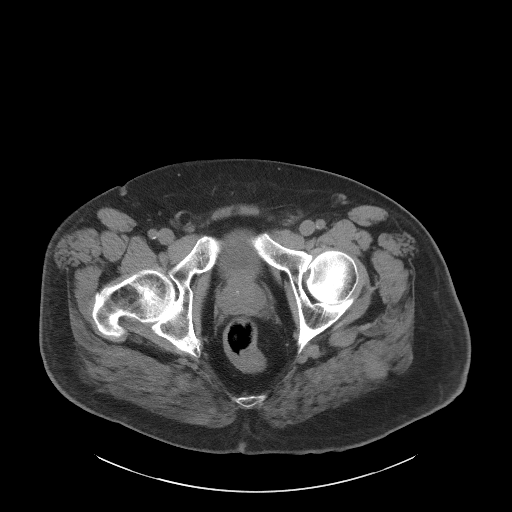
[im 29/101  soft-tissue]
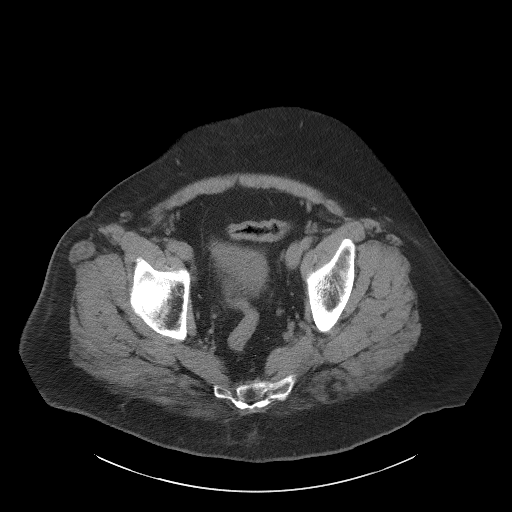
[im 37/101  soft-tissue]
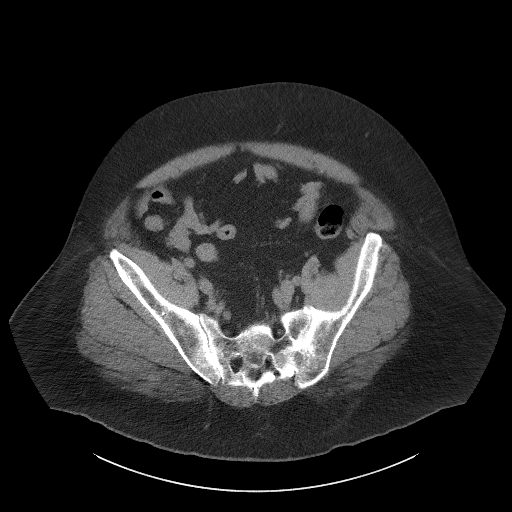
[im 45/101  soft-tissue]
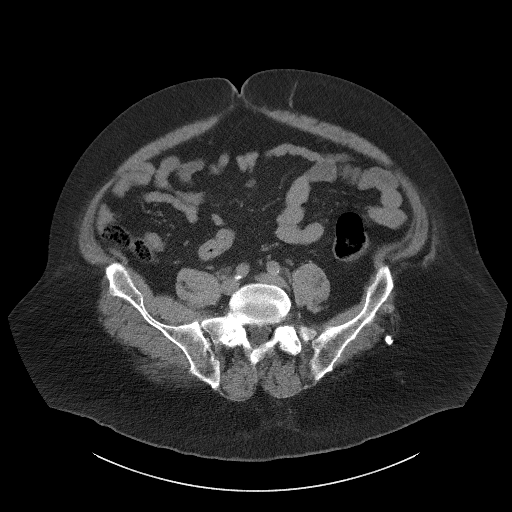
[im 53/101  soft-tissue]
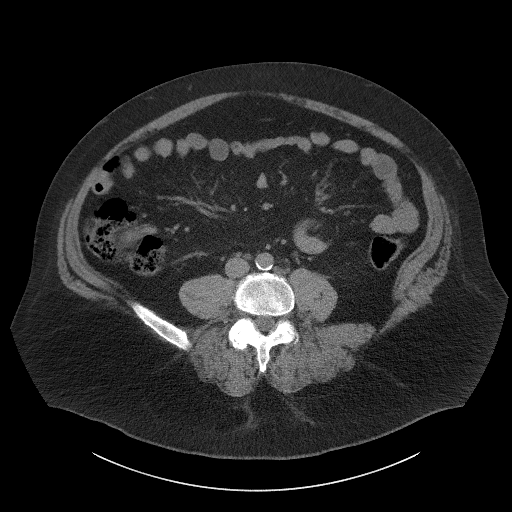
[im 57/101  soft-tissue]
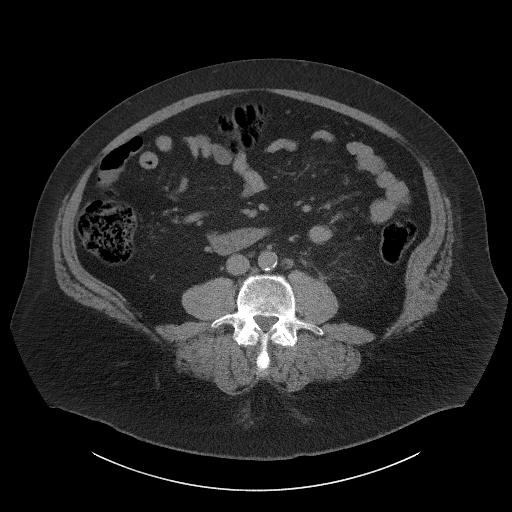
[im 65/101  soft-tissue]
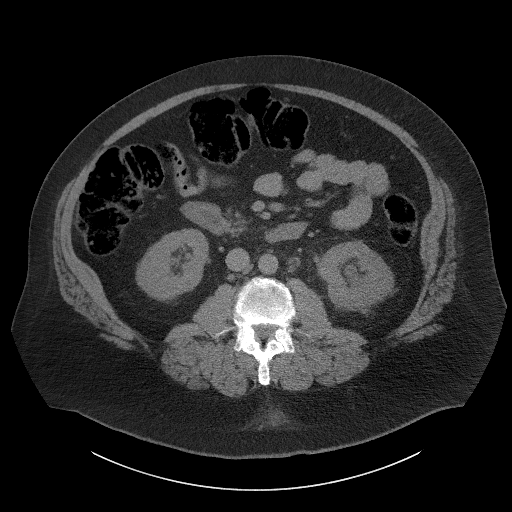
[im 65/101  bone]
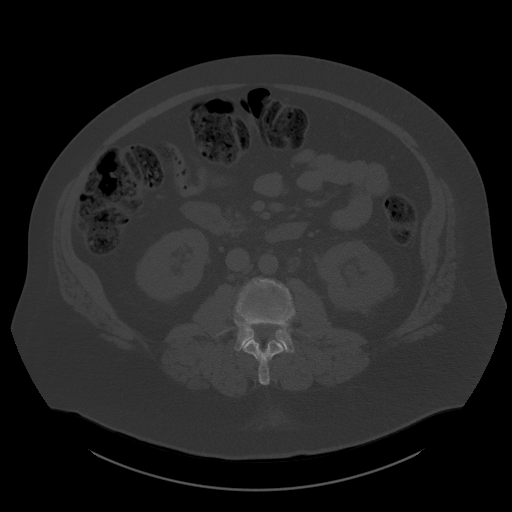
[im 73/101  soft-tissue]
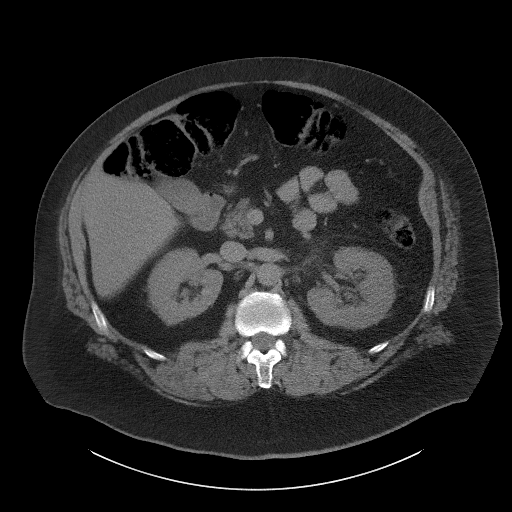
[im 81/101  soft-tissue]
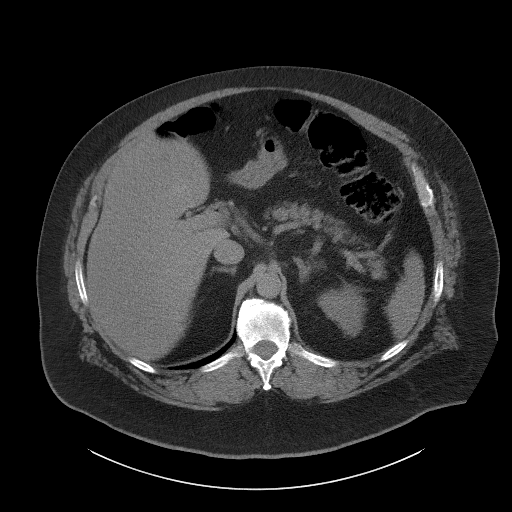
[im 89/101  soft-tissue]
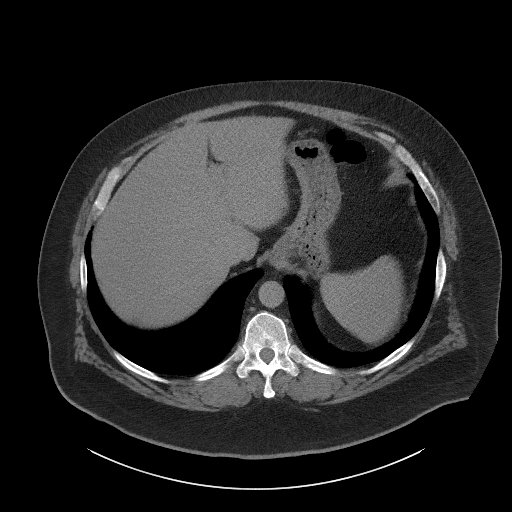
[im 97/101  soft-tissue]
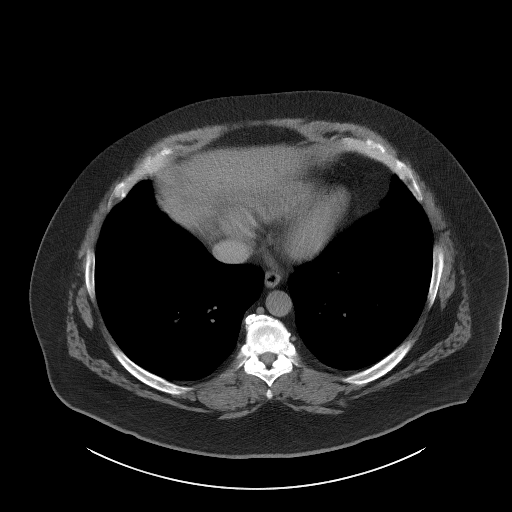

[Series 5: coronal · coronal · 0.96mm/px · 3 of 189 slices shown]
[im 63/189  soft-tissue]
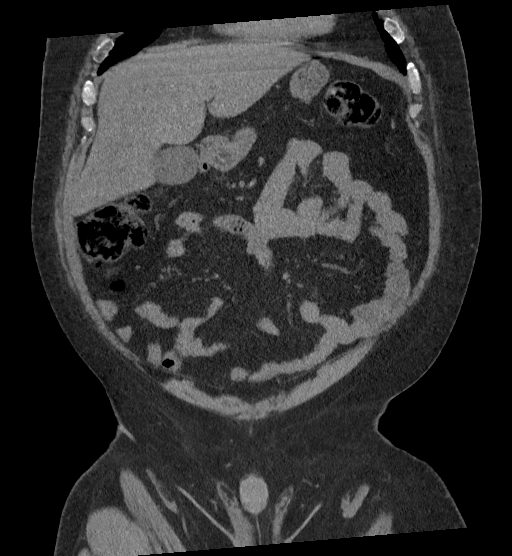
[im 84/189  soft-tissue]
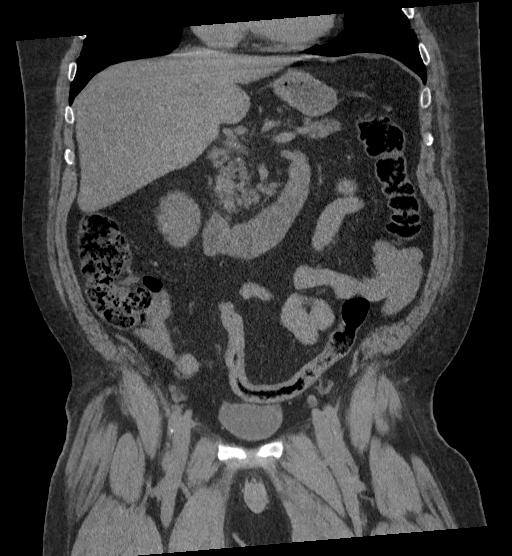
[im 105/189  soft-tissue]
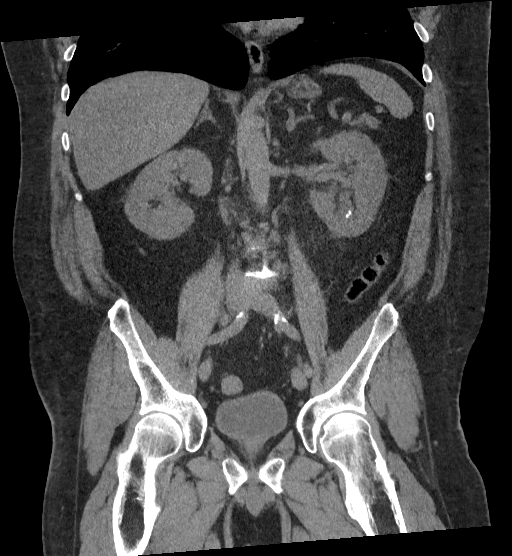

[16 of 46 positions shown; findings below may reference images not displayed]

FINDINGS: Lower chest: The lung bases are clear. The heart size is normal.

Hepatobiliary: There is decreased hepatic attenuation suggestive of
hepatic steatosis. Normal gallbladder.There is no biliary ductal
dilation.

Pancreas: Normal contours without ductal dilatation. No
peripancreatic fluid collection.

Spleen: Unremarkable.

Adrenals/Urinary Tract:

--Adrenal glands: Unremarkable.

--Right kidney/ureter: There are multiple small nonobstructing
stones in the right kidney measuring up to approximately 4 mm. No
right-sided hydronephrosis.

--Left kidney/ureter: There is mild left-sided hydronephrosis
secondary to an obstructing 1.1 cm stone in the left renal pelvis.
Additional nonobstructing punctate stones are noted in the lower
pole the left kidney. There are no left ureteral stones.

--Urinary bladder: Unremarkable.

Stomach/Bowel:

--Stomach/Duodenum: No hiatal hernia or other gastric abnormality.
Normal duodenal course and caliber.

--Small bowel: Unremarkable.

--Colon: Unremarkable.

--Appendix: Normal.

Vascular/Lymphatic: Atherosclerotic calcification is present within
the non-aneurysmal abdominal aorta, without hemodynamically
significant stenosis.

--there are few mildly enlarged retroperitoneal lymph nodes, likely
reactive.

--No mesenteric lymphadenopathy.

--No pelvic or inguinal lymphadenopathy.

Reproductive: Unremarkable

Other: No ascites or free air. The abdominal wall is normal.

Musculoskeletal. No acute displaced fractures.
IMPRESSION: 1. Mild left-sided hydronephrosis secondary to an obstructing 1.1 cm
stone in the left renal pelvis.
2. Bilateral nonobstructing nephrolithiasis.
3. Hepatic steatosis.

Aortic Atherosclerosis ([id number].B).

## 2021-02-28 IMAGING — CR DG ABDOMEN 1V
1 series · 3 of 3 positions shown · non-contrast
Comparison: CT abdomen and pelvis 07/19/2020

CLINICAL DATA: Nephrolithiasis.

EXAM:
ABDOMEN - 1 VIEW

[Series 1: dg abd 1 view · 0.14mm/px · 3 of 3 slices shown]
[im 1/3]
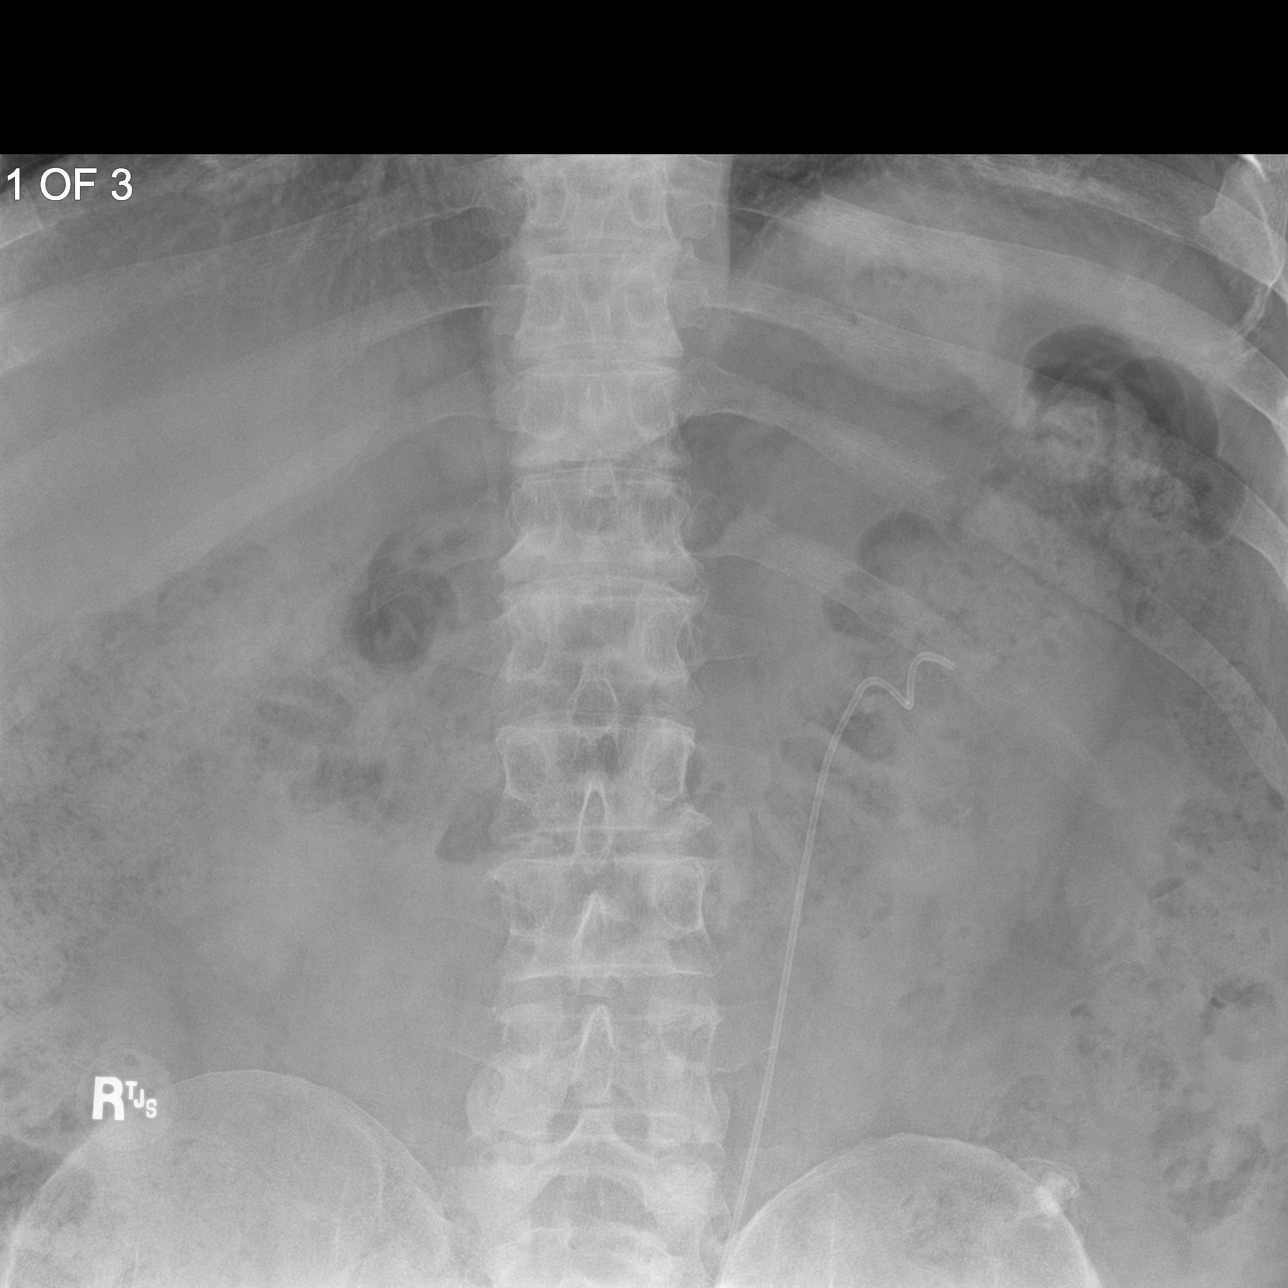
[im 2/3]
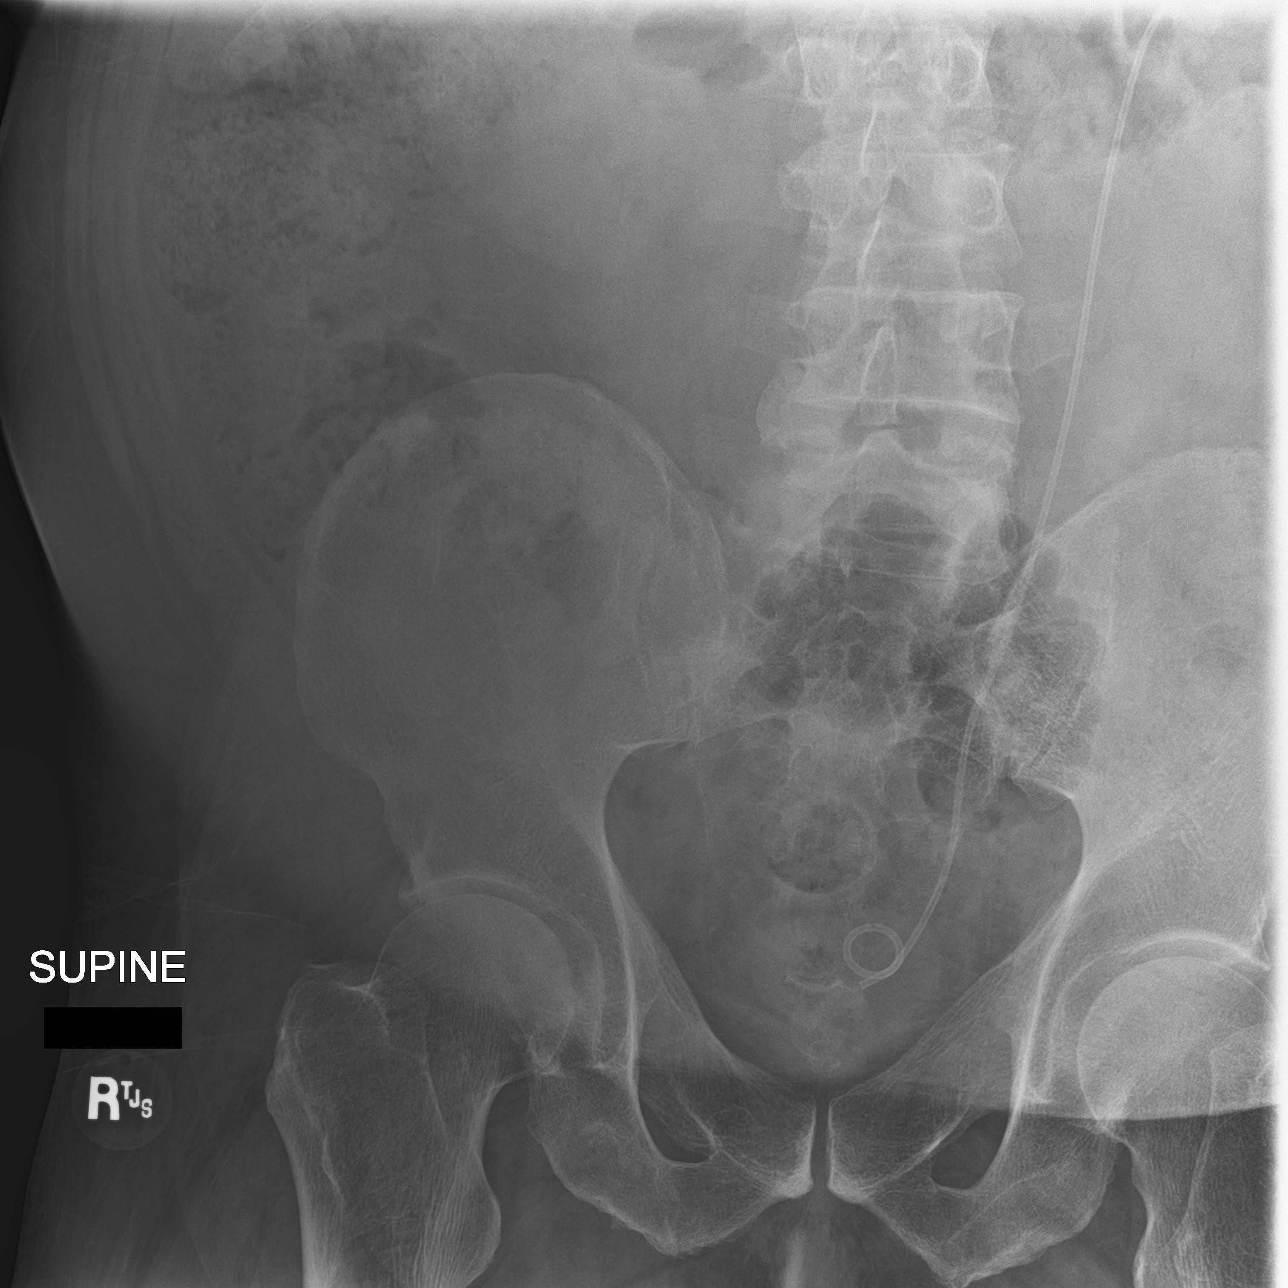
[im 3/3]
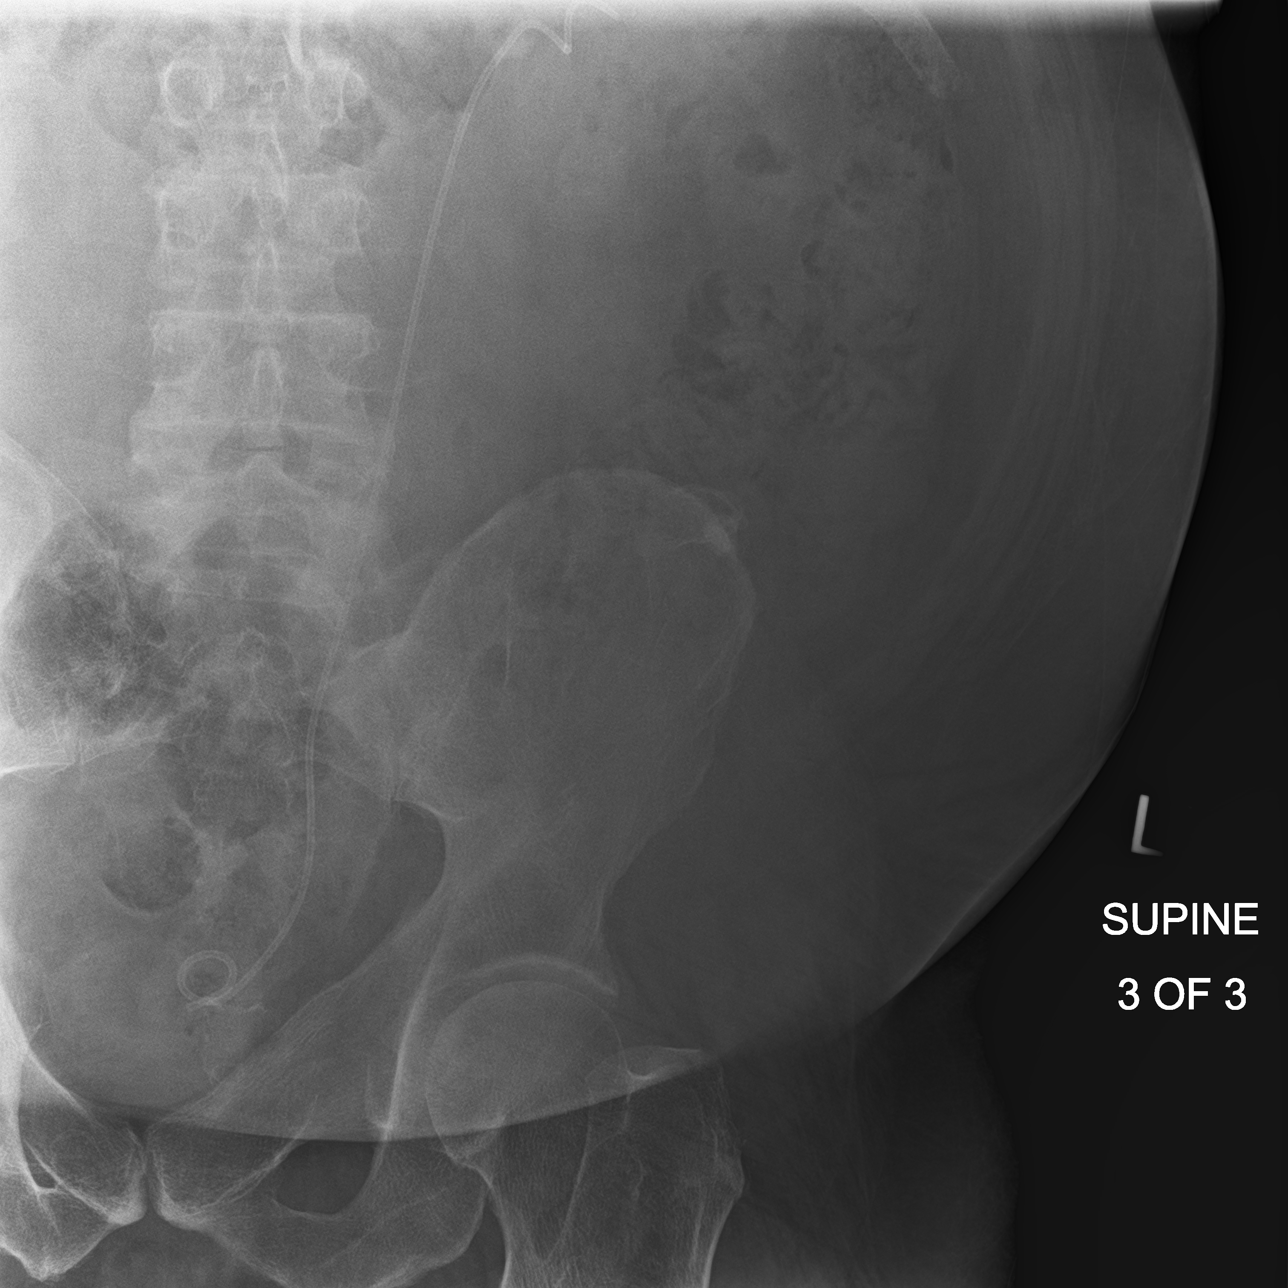

[3 of 3 positions shown; findings below may reference images not displayed]

FINDINGS: A left ureteral stent has been placed and extends from the expected
region of the left renal pelvis/interpolar kidney to the bladder. A
faint 6 mm density adjacent to the proximal aspect of the stent may
represent the left renal pelvis stone on the prior CT, however
assessment is limited by overlying stool. A faint 3 mm density
projecting in the expected region of the lower pole of the left
kidney may correspond to the 2 punctate stones on CT. The punctate
right renal calculi on CT are not clearly seen radiographically,
with assessment again limited by stool. No dilated loops of bowel
are seen to suggest obstruction. No acute osseous abnormality is
identified.
IMPRESSION: Interval left cerebral stent placement. Suspected persistent faint
left renal calculi as above, with assessment limited by stool

## 2021-07-07 ENCOUNTER — Other Ambulatory Visit: Payer: Self-pay | Admitting: Family Medicine

## 2021-07-07 NOTE — Telephone Encounter (Signed)
Courtesy refill. Future visit in 4 weeks

## 2021-08-04 ENCOUNTER — Encounter: Payer: Self-pay | Admitting: Family Medicine

## 2021-08-04 ENCOUNTER — Ambulatory Visit (INDEPENDENT_AMBULATORY_CARE_PROVIDER_SITE_OTHER): Payer: 59 | Admitting: Family Medicine

## 2021-08-04 ENCOUNTER — Other Ambulatory Visit: Payer: Self-pay

## 2021-08-04 VITALS — BP 131/70 | HR 78 | Temp 97.6°F | Ht 73.0 in | Wt 338.2 lb

## 2021-08-04 DIAGNOSIS — E785 Hyperlipidemia, unspecified: Secondary | ICD-10-CM

## 2021-08-04 DIAGNOSIS — I152 Hypertension secondary to endocrine disorders: Secondary | ICD-10-CM

## 2021-08-04 DIAGNOSIS — Z23 Encounter for immunization: Secondary | ICD-10-CM | POA: Diagnosis not present

## 2021-08-04 DIAGNOSIS — Z6841 Body Mass Index (BMI) 40.0 and over, adult: Secondary | ICD-10-CM

## 2021-08-04 DIAGNOSIS — M67449 Ganglion, unspecified hand: Secondary | ICD-10-CM

## 2021-08-04 DIAGNOSIS — E1159 Type 2 diabetes mellitus with other circulatory complications: Secondary | ICD-10-CM

## 2021-08-04 DIAGNOSIS — F17200 Nicotine dependence, unspecified, uncomplicated: Secondary | ICD-10-CM | POA: Diagnosis not present

## 2021-08-04 DIAGNOSIS — E1169 Type 2 diabetes mellitus with other specified complication: Secondary | ICD-10-CM | POA: Diagnosis not present

## 2021-08-04 LAB — POCT GLYCOSYLATED HEMOGLOBIN (HGB A1C): Hemoglobin A1C: 7.2 % — AB (ref 4.0–5.6)

## 2021-08-04 MED ORDER — EMPAGLIFLOZIN 25 MG PO TABS
ORAL_TABLET | ORAL | 1 refills | Status: DC
Start: 2021-08-04 — End: 2022-01-21

## 2021-08-04 NOTE — Assessment & Plan Note (Signed)
Previously well controlled Continue statin Reviewed FLP and CMP

## 2021-08-04 NOTE — Assessment & Plan Note (Signed)
Remains precontemplative at this time Continue to reassess

## 2021-08-04 NOTE — Assessment & Plan Note (Signed)
Well controlled Continue diet control Recheck metabolic panel F/u in 6 months

## 2021-08-04 NOTE — Patient Instructions (Addendum)
Call and schedule your eye exam  Call [facility] to schedule your repeat colonoscopy  Ortho will call to schedule an appointment for your thumb

## 2021-08-04 NOTE — Assessment & Plan Note (Signed)
Discussed importance of healthy weight management Discussed diet and exercise  

## 2021-08-04 NOTE — Assessment & Plan Note (Addendum)
Increased slightly today at 7.2 Associated with HTN and HLD Continue current medications, but take metformin with better compliance and increase Jardiance to 25mg  daily UTD on vaccines, needs eye exam, foot exam today On Statin Discussed diet and exercise

## 2021-08-04 NOTE — Progress Notes (Signed)
Established patient visit   Patient: Dillon Bolton.   DOB: 1965/11/02   55 y.o. Male  MRN: [id number] Visit Date: 08/04/2021  Today's healthcare provider: Lavon Paganini, MD   Chief Complaint  Patient presents with   Hypertension   Hyperlipidemia   Diabetes   Subjective    HPI  -Patient aware he needs to get updated eye exam Hypertension, follow-up  BP Readings from Last 3 Encounters:  08/04/21 131/70  01/02/21 (!) 119/57  08/09/20 122/66   Wt Readings from Last 3 Encounters:  08/04/21 (!) 338 lb 3.2 oz (153.4 kg)  01/02/21 (!) 328 lb 4.8 oz (148.9 kg)  08/09/20 (!) 308 lb (139.7 kg)     He was last seen for hypertension 6 months ago.  BP at that visit was 119/57. Management since that visit includes continue current medications.  He reports good compliance with treatment. He is not having side effects.  He is following a Regular diet. He is not exercising. He does smoke.  Use of agents associated with hypertension: none.   Outside blood pressures are none. Symptoms: No chest pain No chest pressure  No palpitations No syncope  No dyspnea No orthopnea  No paroxysmal nocturnal dyspnea No lower extremity edema   Pertinent labs: Lab Results  Component Value Date   CHOL 145 01/02/2021   HDL 36 (L) 01/02/2021   LDLCALC 90 01/02/2021   TRIG 101 01/02/2021   CHOLHDL 3.3 09/28/2019   Lab Results  Component Value Date   NA 141 01/02/2021   K 4.6 01/02/2021   CREATININE 0.71 (L) 01/02/2021   EGFR 109 01/02/2021   GFRNONAA >60 07/18/2020   GLUCOSE 104 (H) 01/02/2021     The 10-year ASCVD risk score (Arnett DK, et al., 2019) is: 18.4%   ---------------------------------------------------------------------------------------------------  Diabetes Mellitus Type II, Follow-up  Lab Results  Component Value Date   HGBA1C 7.2 (A) 08/04/2021   HGBA1C 6.8 (H) 01/02/2021   HGBA1C 6.4 (A) 07/23/2020   Wt Readings from Last 3 Encounters:   08/04/21 (!) 338 lb 3.2 oz (153.4 kg)  01/02/21 (!) 328 lb 4.8 oz (148.9 kg)  08/09/20 (!) 308 lb (139.7 kg)   Last seen for diabetes 6 months ago.  Management since then includes continue current medication. He reports good compliance with treatment. He is not having side effects.  Symptoms: No fatigue No foot ulcerations  No appetite changes No nausea  No paresthesia of the feet  No polydipsia  No polyuria No visual disturbances   No vomiting     Home blood sugar records:  none  Episodes of hypoglycemia? No    Current insulin regiment: none Most Recent Eye Exam: Patient scheduling visit Current exercise: none Current diet habits: in general, a "healthy" diet    Pertinent Labs: Lab Results  Component Value Date   CHOL 145 01/02/2021   HDL 36 (L) 01/02/2021   LDLCALC 90 01/02/2021   TRIG 101 01/02/2021   CHOLHDL 3.3 09/28/2019   Lab Results  Component Value Date   NA 141 01/02/2021   K 4.6 01/02/2021   CREATININE 0.71 (L) 01/02/2021   EGFR 109 01/02/2021   GFRNONAA >60 07/18/2020   GLUCOSE 104 (H) 01/02/2021     ---------------------------------------------------------------------------------------------------  Lipid/Cholesterol, Follow-up  Last lipid panel Other pertinent labs  Lab Results  Component Value Date   CHOL 145 01/02/2021   HDL 36 (L) 01/02/2021   LDLCALC 90 01/02/2021   TRIG 101 01/02/2021  CHOLHDL 3.3 09/28/2019   Lab Results  Component Value Date   ALT 31 01/02/2021   AST 17 01/02/2021   PLT 218 01/02/2021     He was last seen for this 6 months ago.  Management since that visit includes continue statin.  He reports good compliance with treatment. He is not having side effects.   Symptoms: No chest pain No chest pressure/discomfort  No dyspnea No lower extremity edema  No numbness or tingling of extremity No orthopnea  No palpitations No paroxysmal nocturnal dyspnea  No speech difficulty No syncope   Current diet: in general,  a "healthy" diet   Current exercise: none  The 10-year ASCVD risk score (Arnett DK, et al., 2019) is: 18.4%   R hand dominant ---------------------------------------------------------------------------------------------------    Medications: Outpatient Medications Prior to Visit  Medication Sig   atorvastatin (LIPITOR) 40 MG tablet TAKE 1 TABLET BY MOUTH EVERY DAY   Meloxicam 15 MG TBDP Take 15 mg by mouth daily.    metFORMIN (GLUCOPHAGE) 1000 MG tablet TAKE 1 TABLET (1,000 MG TOTAL) BY MOUTH 2 (TWO) TIMES DAILY WITH A MEAL.   [DISCONTINUED] JARDIANCE 10 MG TABS tablet TAKE 1 TABLET BY MOUTH EVERY DAY BEFORE BREAKFAST   No facility-administered medications prior to visit.    Review of Systems - per HPI      Objective    BP 131/70 (BP Location: Left Arm, Patient Position: Sitting, Cuff Size: Large)   Pulse 78   Temp 97.6 F (36.4 C) (Oral)   Ht '6\' 1"'  (1.854 m)   Wt (!) 338 lb 3.2 oz (153.4 kg)   BMI 44.62 kg/m  BP Readings from Last 3 Encounters:  08/04/21 131/70  01/02/21 (!) 119/57  08/09/20 122/66   Wt Readings from Last 3 Encounters:  08/04/21 (!) 338 lb 3.2 oz (153.4 kg)  01/02/21 (!) 328 lb 4.8 oz (148.9 kg)  08/09/20 (!) 308 lb (139.7 kg)      Physical Exam Vitals reviewed.  Constitutional:      General: He is not in acute distress.    Appearance: Normal appearance. He is not diaphoretic.  HENT:     Head: Normocephalic and atraumatic.  Eyes:     General: No scleral icterus.    Conjunctiva/sclera: Conjunctivae normal.  Cardiovascular:     Rate and Rhythm: Normal rate and regular rhythm.     Pulses: Normal pulses.     Heart sounds: Normal heart sounds. No murmur heard. Pulmonary:     Effort: Pulmonary effort is normal. No respiratory distress.     Breath sounds: Normal breath sounds. No wheezing or rhonchi.  Musculoskeletal:     Cervical back: Neck supple.     Right lower leg: No edema.     Left lower leg: No edema.     Comments: Cyst over  lateral aspect of L thumb DIP  Lymphadenopathy:     Cervical: No cervical adenopathy.  Skin:    General: Skin is warm and dry.     Capillary Refill: Capillary refill takes less than 2 seconds.     Findings: No rash.  Neurological:     Mental Status: He is alert and oriented to person, place, and time.     Cranial Nerves: No cranial nerve deficit.  Psychiatric:        Mood and Affect: Mood normal.        Behavior: Behavior normal.      Results for orders placed or performed in visit on 08/04/21  POCT HgB A1C  Result Value Ref Range   Hemoglobin A1C 7.2 (A) 4.0 - 5.6 %   HbA1c POC (<> result, manual entry)     HbA1c, POC (prediabetic range)     HbA1c, POC (controlled diabetic range)      Assessment & Plan     Problem List Items Addressed This Visit       Cardiovascular and Mediastinum   Hypertension associated with diabetes ([place]) - Primary    Well controlled Continue diet control Recheck metabolic panel F/u in 6 months       Relevant Medications   empagliflozin (JARDIANCE) 25 MG TABS tablet   Other Relevant Orders   Basic Metabolic Panel (BMET)     Endocrine   Hyperlipidemia associated with type 2 diabetes mellitus ([place])    Previously well controlled Continue statin Reviewed FLP and CMP      Relevant Medications   empagliflozin (JARDIANCE) 25 MG TABS tablet   T2DM (type 2 diabetes mellitus) (HCC)    Increased slightly today at 7.2 Associated with HTN and HLD Continue current medications, but take metformin with better compliance and increase Jardiance to 67m daily UTD on vaccines, needs eye exam, foot exam today On Statin Discussed diet and exercise      Relevant Medications   empagliflozin (JARDIANCE) 25 MG TABS tablet   Other Relevant Orders   POCT HgB A1C (Completed)     Other   Morbid obesity ([place]    Discussed importance of healthy weight management Discussed diet and exercise       Relevant Medications   empagliflozin (JARDIANCE) 25 MG  TABS tablet   Tobacco use disorder    Remains precontemplative at this time Continue to reassess      Other Visit Diagnoses     BMI 40.0-44.9, adult (HCC)       Relevant Medications   empagliflozin (JARDIANCE) 25 MG TABS tablet   Ganglion cyst of finger       Relevant Orders   Ambulatory referral to Orthopedic Surgery        Return in about 6 months (around 02/02/2022) for CPE.      I, ALavon Paganini MD, have reviewed all documentation for this visit. The documentation on 08/04/21 for the exam, diagnosis, procedures, and orders are all accurate and complete.   Rosalia Mcavoy, ADionne Bucy MD, MPH [facility]

## 2021-08-05 LAB — BASIC METABOLIC PANEL
BUN/Creatinine Ratio: 20 (ref 9–20)
BUN: 18 mg/dL (ref 6–24)
CO2: 22 mmol/L (ref 20–29)
Calcium: 9.2 mg/dL (ref 8.7–10.2)
Chloride: 103 mmol/L (ref 96–106)
Creatinine, Ser: 0.89 mg/dL (ref 0.76–1.27)
Glucose: 121 mg/dL — ABNORMAL HIGH (ref 70–99)
Potassium: 4.1 mmol/L (ref 3.5–5.2)
Sodium: 138 mmol/L (ref 134–144)
eGFR: 101 mL/min/{1.73_m2} (ref >59)

## 2021-08-07 ENCOUNTER — Other Ambulatory Visit: Payer: Self-pay | Admitting: Family Medicine

## 2021-08-08 ENCOUNTER — Other Ambulatory Visit: Payer: Self-pay | Admitting: Family Medicine

## 2021-08-08 NOTE — Telephone Encounter (Signed)
Requested Prescriptions  Pending Prescriptions Disp Refills  . metFORMIN (GLUCOPHAGE) 1000 MG tablet [Pharmacy Med Name: METFORMIN HCL 1,000 MG TABLET] 60 tablet 5    Sig: TAKE 1 TABLET (1,000 MG TOTAL) BY MOUTH 2 (TWO) TIMES DAILY WITH A MEAL.     Endocrinology:  Diabetes - Biguanides Passed - 08/08/2021  1:32 AM      Passed - Cr in normal range and within 360 days    Creatinine, Ser  Date Value Ref Range Status  08/04/2021 0.89 0.76 - 1.27 mg/dL Final         Passed - HBA1C is between 0 and 7.9 and within 180 days    Hemoglobin A1C  Date Value Ref Range Status  08/04/2021 7.2 (A) 4.0 - 5.6 % Final   Hgb A1c MFr Bld  Date Value Ref Range Status  01/02/2021 6.8 (H) 4.8 - 5.6 % Final    Comment:             Prediabetes: 5.7 - 6.4          Diabetes: >6.4          Glycemic control for adults with diabetes: <7.0          Passed - eGFR in normal range and within 360 days    GFR calc Af Amer  Date Value Ref Range Status  09/28/2019 124 >59 mL/min/1.73 Final   GFR calc non Af Amer  Date Value Ref Range Status  07/18/2020 >60 >60 mL/min Final   eGFR  Date Value Ref Range Status  08/04/2021 101 >59 mL/min/1.73 Final         Passed - Valid encounter within last 6 months    Recent Outpatient Visits          4 days ago Hypertension associated with diabetes [facility])   [place], Dionne Bucy, MD   7 months ago Encounter for annual physical exam   [facility], Dionne Bucy, MD   1 year ago Hypertension associated with diabetes [facility])   [facility], Dionne Bucy, MD   1 year ago Hypertension associated with diabetes [facility])   [facility] [place], Dionne Bucy, MD   1 year ago Encounter for annual physical exam   [facility], Dionne Bucy, MD

## 2022-01-21 ENCOUNTER — Other Ambulatory Visit: Payer: Self-pay | Admitting: Family Medicine

## 2022-02-20 ENCOUNTER — Other Ambulatory Visit: Payer: Self-pay | Admitting: Family Medicine

## 2022-03-18 ENCOUNTER — Other Ambulatory Visit: Payer: Self-pay | Admitting: Family Medicine

## 2022-03-18 NOTE — Telephone Encounter (Signed)
Secured appt for 03/27/22 Requested Prescriptions  Pending Prescriptions Disp Refills  . JARDIANCE 25 MG TABS tablet [Pharmacy Med Name: JARDIANCE 25 MG TABLET] 30 tablet 0    Sig: PLEASE SCHEDULE OFFICE VISIT BEFORE ANY FUTURE REFILL.     Endocrinology:  Diabetes - SGLT2 Inhibitors Failed - 03/18/2022  3:30 AM      Failed - HBA1C is between 0 and 7.9 and within 180 days    Hemoglobin A1C  Date Value Ref Range Status  08/04/2021 7.2 (A) 4.0 - 5.6 % Final   Hgb A1c MFr Bld  Date Value Ref Range Status  01/02/2021 6.8 (H) 4.8 - 5.6 % Final    Comment:             Prediabetes: 5.7 - 6.4          Diabetes: >6.4          Glycemic control for adults with diabetes: <7.0          Failed - Valid encounter within last 6 months    Recent Outpatient Visits          7 months ago Hypertension associated with diabetes [facility])   [facility] [place], Dillon Bucy, MD   1 year ago Encounter for annual physical exam   [facility] [place], Dillon Bucy, MD   1 year ago Hypertension associated with diabetes [facility])   [facility], Dillon Bucy, MD   1 year ago Hypertension associated with diabetes [facility])   [facility], Dillon Bucy, MD   2 years ago Encounter for annual physical exam   [facility] [place], Dillon Bucy, MD      Future Appointments            In 1 week Bacigalupo, Dillon Bucy, MD [facility]           Passed - Cr in normal range and within 360 days    Creatinine, Ser  Date Value Ref Range Status  08/04/2021 0.89 0.76 - 1.27 mg/dL Final         Passed - eGFR in normal range and within 360 days    GFR calc Af Amer  Date Value Ref Range Status  09/28/2019 124 >59 mL/min/1.73 Final   GFR calc non Af Amer  Date Value Ref Range Status  07/18/2020 >60 >60 mL/min Final   eGFR  Date Value Ref Range Status  08/04/2021 101 >59 mL/min/1.73 Final         . metFORMIN  (GLUCOPHAGE) 1000 MG tablet [Pharmacy Med Name: METFORMIN HCL 1,000 MG TABLET] 60 tablet 0    Sig: TAKE 1 TABLET BY MOUTH 2 (TWO) TIMES DAILY WITH A MEAL. PLEASE SCHEDULE OFFICE VISIT BEFORE ANY FUTURE REFILL.     Endocrinology:  Diabetes - Biguanides Failed - 03/18/2022  3:30 AM      Failed - HBA1C is between 0 and 7.9 and within 180 days    Hemoglobin A1C  Date Value Ref Range Status  08/04/2021 7.2 (A) 4.0 - 5.6 % Final   Hgb A1c MFr Bld  Date Value Ref Range Status  01/02/2021 6.8 (H) 4.8 - 5.6 % Final    Comment:             Prediabetes: 5.7 - 6.4          Diabetes: >6.4          Glycemic control for adults with diabetes: <7.0  Failed - B12 Level in normal range and within 720 days    No results found for: VITAMINB12       Failed - Valid encounter within last 6 months    Recent Outpatient Visits          7 months ago Hypertension associated with diabetes [facility])   [facility] [place], Dillon Bucy, MD   1 year ago Encounter for annual physical exam   [facility] [place], Dillon Bucy, MD   1 year ago Hypertension associated with diabetes [facility])   [facility], Dillon Bucy, MD   1 year ago Hypertension associated with diabetes [facility])   [facility], Dillon Bucy, MD   2 years ago Encounter for annual physical exam   [facility] [place], Dillon Bucy, MD      Future Appointments            In 1 week Bacigalupo, Dillon Bucy, MD [facility]           Failed - CBC within normal limits and completed in the last 12 months    WBC  Date Value Ref Range Status  01/02/2021 9.1 3.4 - 10.8 x10E3/uL Final  07/18/2020 17.7 (H) 4.0 - 10.5 K/uL Final   RBC  Date Value Ref Range Status  01/02/2021 5.02 4.14 - 5.80 x10E6/uL Final  07/18/2020 5.11 4.22 - 5.81 MIL/uL Final   Hemoglobin  Date Value Ref Range Status  01/02/2021 15.6 13.0 - 17.7 g/dL Final   Hematocrit   Date Value Ref Range Status  01/02/2021 45.8 37.5 - 51.0 % Final   MCHC  Date Value Ref Range Status  01/02/2021 34.1 31.5 - 35.7 g/dL Final  07/18/2020 33.5 30.0 - 36.0 g/dL Final   [facility]  Date Value Ref Range Status  01/02/2021 31.1 26.6 - 33.0 pg Final  07/18/2020 30.7 26.0 - 34.0 pg Final   MCV  Date Value Ref Range Status  01/02/2021 91 79 - 97 fL Final   No results found for: PLTCOUNTKUC, LABPLAT, POCPLA RDW  Date Value Ref Range Status  01/02/2021 13.1 11.6 - 15.4 % Final         Passed - Cr in normal range and within 360 days    Creatinine, Ser  Date Value Ref Range Status  08/04/2021 0.89 0.76 - 1.27 mg/dL Final         Passed - eGFR in normal range and within 360 days    GFR calc Af Amer  Date Value Ref Range Status  09/28/2019 124 >59 mL/min/1.73 Final   GFR calc non Af Amer  Date Value Ref Range Status  07/18/2020 >60 >60 mL/min Final   eGFR  Date Value Ref Range Status  08/04/2021 101 >59 mL/min/1.73 Final

## 2022-03-19 ENCOUNTER — Other Ambulatory Visit: Payer: Self-pay | Admitting: Family Medicine

## 2022-03-19 NOTE — Telephone Encounter (Signed)
Appointment 03/27/22- courtesy RF given Requested Prescriptions  Pending Prescriptions Disp Refills  . atorvastatin (LIPITOR) 40 MG tablet [Pharmacy Med Name: ATORVASTATIN 40 MG TABLET] 30 tablet 0    Sig: TAKE 1 TABLET BY MOUTH DAILY. PLEASE SCHEDULE OFFICE VISIT BEFORE ANY FUTURE REFILL.     Cardiovascular:  Antilipid - Statins Failed - 03/19/2022  3:59 AM      Failed - Lipid Panel in normal range within the last 12 months    Cholesterol, Total  Date Value Ref Range Status  01/02/2021 145 100 - 199 mg/dL Final   LDL Chol Calc (NIH)  Date Value Ref Range Status  01/02/2021 90 0 - 99 mg/dL Final   HDL  Date Value Ref Range Status  01/02/2021 36 (L) >39 mg/dL Final   Triglycerides  Date Value Ref Range Status  01/02/2021 101 0 - 149 mg/dL Final         Passed - Patient is not pregnant      Passed - Valid encounter within last 12 months    Recent Outpatient Visits          7 months ago Hypertension associated with diabetes [facility])   [facility] [place], Dionne Bucy, MD   1 year ago Encounter for annual physical exam   [facility] [place], Dionne Bucy, MD   1 year ago Hypertension associated with diabetes [facility])   [facility], Dionne Bucy, MD   1 year ago Hypertension associated with diabetes [facility])   [facility], Dionne Bucy, MD   2 years ago Encounter for annual physical exam   [facility] [place], Dionne Bucy, MD      Future Appointments            In 1 week Bacigalupo, Dionne Bucy, MD [facility]

## 2022-03-24 NOTE — Progress Notes (Signed)
Established patient visit   Patient: Dillon Bolton.   DOB: 1966/05/30   56 y.o. Male  MRN: [id number] Visit Date: 03/27/2022  Today's healthcare provider: Lavon Paganini, MD   Chief Complaint  Patient presents with   Hypertension   Diabetes   Hyperlipidemia   Subjective    HPI  Diabetes Mellitus Type II, follow-up  Lab Results  Component Value Date   HGBA1C 7.2 (A) 08/04/2021   HGBA1C 6.8 (H) 01/02/2021   HGBA1C 6.4 (A) 07/23/2020   Last seen for diabetes 7 months ago.  Management since then includes Continue current medications, but take metformin with better compliance and increase Jardiance to 68m daily.  Home blood sugar records:  not being checked  Episodes of hypoglycemia? No    Current insulin regiment: none Most Recent Eye Exam: patient is due  --------------------------------------------------------------------------------------------------- Hypertension, follow-up  BP Readings from Last 3 Encounters:  03/27/22 (!) 148/76  08/04/21 131/70  01/02/21 (!) 119/57   Wt Readings from Last 3 Encounters:  03/27/22 (!) 332 lb (150.6 kg)  08/04/21 (!) 338 lb 3.2 oz (153.4 kg)  01/02/21 (!) 328 lb 4.8 oz (148.9 kg)     He was last seen for hypertension 7 months ago.  BP at that visit was 131/70. Management since that visit includes Continue diet control. He is not exercising. He is not adherent to low salt diet.   Outside blood pressures are is not being checked.  He does smoke.  --------------------------------------------------------------------------------------------------- Lipid/Cholesterol, follow-up  Last Lipid Panel: Lab Results  Component Value Date   CHOL 145 01/02/2021   LDLCALC 90 01/02/2021   HDL 36 (L) 01/02/2021   TRIG 101 01/02/2021    He was last seen for this 7 months ago.  Management since that visit includes Continue statin.   Symptoms: No appetite changes No foot ulcerations  No chest pain No chest  pressure/discomfort  No dyspnea No orthopnea  No fatigue No lower extremity edema  No palpitations No paroxysmal nocturnal dyspnea  No nausea No numbness or tingling of extremity  No polydipsia No polyuria  No speech difficulty No syncope    Last metabolic panel Lab Results  Component Value Date   GLUCOSE 121 (H) 08/04/2021   NA 138 08/04/2021   K 4.1 08/04/2021   BUN 18 08/04/2021   CREATININE 0.89 08/04/2021   EGFR 101 08/04/2021   GFRNONAA >60 07/18/2020   CALCIUM 9.2 08/04/2021   AST 17 01/02/2021   ALT 31 01/02/2021   The 10-year ASCVD risk score (Arnett DK, et al., 2019) is: 22.3%  ---------------------------------------------------------------------------------------------------   Medications: Outpatient Medications Prior to Visit  Medication Sig   atorvastatin (LIPITOR) 40 MG tablet Take 1 tablet (40 mg total) by mouth daily.   JARDIANCE 25 MG TABS tablet PLEASE SCHEDULE OFFICE VISIT BEFORE ANY FUTURE REFILL.   Meloxicam 15 MG TBDP Take 15 mg by mouth daily.    metFORMIN (GLUCOPHAGE) 1000 MG tablet TAKE 1 TABLET BY MOUTH 2 (TWO) TIMES DAILY WITH A MEAL. PLEASE SCHEDULE OFFICE VISIT BEFORE ANY FUTURE REFILL.   No facility-administered medications prior to visit.    Review of Systems per HPI     Objective    BP (!) 148/76 (BP Location: Left Arm, Patient Position: Sitting, Cuff Size: Large)   Pulse 83   Temp 97.9 F (36.6 C) (Oral)   Wt (!) 332 lb (150.6 kg)   SpO2 96%   BMI 43.80 kg/m    Physical Exam  Vitals reviewed.  Constitutional:      General: He is not in acute distress.    Appearance: Normal appearance. He is not diaphoretic.  HENT:     Head: Normocephalic and atraumatic.  Eyes:     General: No scleral icterus.    Conjunctiva/sclera: Conjunctivae normal.  Cardiovascular:     Rate and Rhythm: Normal rate and regular rhythm.     Pulses: Normal pulses.     Heart sounds: Normal heart sounds. No murmur heard. Pulmonary:     Effort: Pulmonary  effort is normal. No respiratory distress.     Breath sounds: Normal breath sounds. No wheezing or rhonchi.  Musculoskeletal:     Cervical back: Neck supple.     Right lower leg: No edema.     Left lower leg: No edema.  Lymphadenopathy:     Cervical: No cervical adenopathy.  Skin:    General: Skin is warm and dry.     Findings: No rash.  Neurological:     Mental Status: He is alert and oriented to person, place, and time. Mental status is at baseline.  Psychiatric:        Mood and Affect: Mood normal.        Behavior: Behavior normal.       No results found for any visits on 03/27/22.  Assessment & Plan     Problem List Items Addressed This Visit       Cardiovascular and Mediastinum   Hypertension associated with diabetes ([place])    Chronic and uncontrolled - encourage checking home BPs Continue current medications Recheck metabolic panel F/u in 3 months       Relevant Orders   Comprehensive metabolic panel     Endocrine   Hyperlipidemia associated with type 2 diabetes mellitus ([place])    Previously well controlled Continue statin Repeat FLP and CMP Goal LDL < 70      Relevant Orders   Comprehensive metabolic panel   Lipid panel   T2DM (type 2 diabetes mellitus) ([place]) - Primary    Well controlled  Continue current medications UTD on vaccines, encouraged eye exam uACR today On Statin Discussed diet and exercise F/u in 6 months       Relevant Orders   Microalbumin / creatinine urine ratio   POCT glycosylated hemoglobin (Hb A1C)     Other   Morbid obesity ([place])    Discussed importance of healthy weight management Discussed diet and exercise         Return in about 6 months (around 09/26/2022) for CPE.      I, Lavon Paganini, MD, have reviewed all documentation for this visit. The documentation on 03/27/22 for the exam, diagnosis, procedures, and orders are all accurate and complete.   Josip Merolla, Dionne Bucy, MD, MPH [facility]

## 2022-03-27 ENCOUNTER — Ambulatory Visit (INDEPENDENT_AMBULATORY_CARE_PROVIDER_SITE_OTHER): Payer: 59 | Admitting: Family Medicine

## 2022-03-27 ENCOUNTER — Encounter: Payer: Self-pay | Admitting: Family Medicine

## 2022-03-27 VITALS — BP 148/76 | HR 83 | Temp 97.9°F | Wt 332.0 lb

## 2022-03-27 DIAGNOSIS — I152 Hypertension secondary to endocrine disorders: Secondary | ICD-10-CM

## 2022-03-27 DIAGNOSIS — E1159 Type 2 diabetes mellitus with other circulatory complications: Secondary | ICD-10-CM

## 2022-03-27 DIAGNOSIS — E785 Hyperlipidemia, unspecified: Secondary | ICD-10-CM

## 2022-03-27 DIAGNOSIS — E1169 Type 2 diabetes mellitus with other specified complication: Secondary | ICD-10-CM | POA: Diagnosis not present

## 2022-03-27 LAB — POCT GLYCOSYLATED HEMOGLOBIN (HGB A1C): Hemoglobin A1C: 6.4 % — AB (ref 4.0–5.6)

## 2022-03-27 NOTE — Assessment & Plan Note (Signed)
Well controlled  Continue current medications UTD on vaccines, encouraged eye exam uACR today On Statin Discussed diet and exercise F/u in 6 months

## 2022-03-27 NOTE — Assessment & Plan Note (Signed)
Previously well controlled Continue statin Repeat FLP and CMP Goal LDL < 70 

## 2022-03-27 NOTE — Assessment & Plan Note (Signed)
Discussed importance of healthy weight management Discussed diet and exercise  

## 2022-03-27 NOTE — Assessment & Plan Note (Addendum)
Chronic and uncontrolled - encourage checking home BPs Continue current medications Recheck metabolic panel F/u in 3 months

## 2022-03-28 LAB — LIPID PANEL
Chol/HDL Ratio: 3.3 ratio (ref 0.0–5.0)
Cholesterol, Total: 130 mg/dL (ref 100–199)
HDL: 39 mg/dL — ABNORMAL LOW (ref >39)
LDL Chol Calc (NIH): 74 mg/dL (ref 0–99)
Triglycerides: 86 mg/dL (ref 0–149)
VLDL Cholesterol Cal: 17 mg/dL (ref 5–40)

## 2022-03-28 LAB — COMPREHENSIVE METABOLIC PANEL
ALT: 30 IU/L (ref 0–44)
AST: 17 IU/L (ref 0–40)
Albumin/Globulin Ratio: 1.8 (ref 1.2–2.2)
Albumin: 4.3 g/dL (ref 3.8–4.9)
Alkaline Phosphatase: 95 IU/L (ref 44–121)
BUN/Creatinine Ratio: 25 — ABNORMAL HIGH (ref 9–20)
BUN: 18 mg/dL (ref 6–24)
Bilirubin Total: 0.3 mg/dL (ref 0.0–1.2)
CO2: 21 mmol/L (ref 20–29)
Calcium: 9.5 mg/dL (ref 8.7–10.2)
Chloride: 101 mmol/L (ref 96–106)
Creatinine, Ser: 0.72 mg/dL — ABNORMAL LOW (ref 0.76–1.27)
Globulin, Total: 2.4 g/dL (ref 1.5–4.5)
Glucose: 149 mg/dL — ABNORMAL HIGH (ref 70–99)
Potassium: 4.3 mmol/L (ref 3.5–5.2)
Sodium: 136 mmol/L (ref 134–144)
Total Protein: 6.7 g/dL (ref 6.0–8.5)
eGFR: 108 mL/min/{1.73_m2} (ref >59)

## 2022-03-28 LAB — MICROALBUMIN / CREATININE URINE RATIO
Creatinine, Urine: 43.2 mg/dL
Microalb/Creat Ratio: 24 mg/g creat (ref 0–29)
Microalbumin, Urine: 10.3 ug/mL

## 2022-04-19 ENCOUNTER — Other Ambulatory Visit: Payer: Self-pay | Admitting: Family Medicine

## 2022-05-12 ENCOUNTER — Other Ambulatory Visit: Payer: Self-pay | Admitting: Family Medicine

## 2022-05-13 NOTE — Telephone Encounter (Signed)
Requested Prescriptions  Pending Prescriptions Disp Refills  . atorvastatin (LIPITOR) 40 MG tablet [Pharmacy Med Name: ATORVASTATIN 40 MG TABLET] 90 tablet 0    Sig: TAKE 1 TABLET BY MOUTH EVERY DAY     Cardiovascular:  Antilipid - Statins Failed - 05/12/2022  2:53 AM      Failed - Lipid Panel in normal range within the last 12 months    Cholesterol, Total  Date Value Ref Range Status  03/27/2022 130 100 - 199 mg/dL Final   LDL Chol Calc (NIH)  Date Value Ref Range Status  03/27/2022 74 0 - 99 mg/dL Final   HDL  Date Value Ref Range Status  03/27/2022 39 (L) >39 mg/dL Final   Triglycerides  Date Value Ref Range Status  03/27/2022 86 0 - 149 mg/dL Final         Passed - Patient is not pregnant      Passed - Valid encounter within last 12 months    Recent Outpatient Visits          1 month ago Type 2 diabetes mellitus with other specified complication, without long-term current use of insulin ([place])   [facility] [place], Dionne Bucy, MD   9 months ago Hypertension associated with diabetes [facility])   [facility], Dionne Bucy, MD   1 year ago Encounter for annual physical exam   [facility] [place], Dionne Bucy, MD   1 year ago Hypertension associated with diabetes [facility])   [facility], Dionne Bucy, MD   2 years ago Hypertension associated with diabetes [facility])   [place], Dionne Bucy, MD      Future Appointments            In 2 months Bacigalupo, Dionne Bucy, MD [facility], [place]   In 4 months Bacigalupo, Dionne Bucy, MD [facility], [facility]           . JARDIANCE 25 MG TABS tablet [Pharmacy Med Name: JARDIANCE 25 MG TABLET] 90 tablet 0    Sig: TAKE 1 TABLET BY MOUTH EVERY DAY     Endocrinology:  Diabetes - SGLT2 Inhibitors Failed - 05/12/2022  2:53 AM      Failed - Cr in normal range and within 360 days    Creatinine, Ser  Date Value Ref Range Status   03/27/2022 0.72 (L) 0.76 - 1.27 mg/dL Final         Passed - HBA1C is between 0 and 7.9 and within 180 days    Hemoglobin A1C  Date Value Ref Range Status  03/27/2022 6.4 (A) 4.0 - 5.6 % Final   Hgb A1c MFr Bld  Date Value Ref Range Status  01/02/2021 6.8 (H) 4.8 - 5.6 % Final    Comment:             Prediabetes: 5.7 - 6.4          Diabetes: >6.4          Glycemic control for adults with diabetes: <7.0          Passed - eGFR in normal range and within 360 days    GFR calc Af Amer  Date Value Ref Range Status  09/28/2019 124 >59 mL/min/1.73 Final   GFR calc non Af Amer  Date Value Ref Range Status  07/18/2020 >60 >60 mL/min Final   eGFR  Date Value Ref Range Status  03/27/2022 108 >59 mL/min/1.73 Final  Passed - Valid encounter within last 6 months    Recent Outpatient Visits          1 month ago Type 2 diabetes mellitus with other specified complication, without long-term current use of insulin ([place])   [facility] [place], Dionne Bucy, MD   9 months ago Hypertension associated with diabetes [facility])   [facility], Dionne Bucy, MD   1 year ago Encounter for annual physical exam   [facility], Dionne Bucy, MD   1 year ago Hypertension associated with diabetes [facility])   [facility], Dionne Bucy, MD   2 years ago Hypertension associated with diabetes [facility])   [place], Dionne Bucy, MD      Future Appointments            In 2 months Bacigalupo, Dionne Bucy, MD [facility], [place]   In 4 months Bacigalupo, Dionne Bucy, MD [facility], [facility]           . metFORMIN (GLUCOPHAGE) 1000 MG tablet [Pharmacy Med Name: METFORMIN HCL 1,000 MG TABLET] 180 tablet 0    Sig: TAKE 1 TABLET BY MOUTH 2 TIMES DAILY WITH A MEAL. PLEASE SCHEDULE OFFICE VISIT BEFORE ANY REFILL.     Endocrinology:  Diabetes - Biguanides Failed - 05/12/2022  2:53 AM       Failed - Cr in normal range and within 360 days    Creatinine, Ser  Date Value Ref Range Status  03/27/2022 0.72 (L) 0.76 - 1.27 mg/dL Final         Failed - B12 Level in normal range and within 720 days    No results found for: "VITAMINB12"       Failed - CBC within normal limits and completed in the last 12 months    WBC  Date Value Ref Range Status  01/02/2021 9.1 3.4 - 10.8 x10E3/uL Final  07/18/2020 17.7 (H) 4.0 - 10.5 K/uL Final   RBC  Date Value Ref Range Status  01/02/2021 5.02 4.14 - 5.80 x10E6/uL Final  07/18/2020 5.11 4.22 - 5.81 MIL/uL Final   Hemoglobin  Date Value Ref Range Status  01/02/2021 15.6 13.0 - 17.7 g/dL Final   Hematocrit  Date Value Ref Range Status  01/02/2021 45.8 37.5 - 51.0 % Final   MCHC  Date Value Ref Range Status  01/02/2021 34.1 31.5 - 35.7 g/dL Final  07/18/2020 33.5 30.0 - 36.0 g/dL Final   [facility]  Date Value Ref Range Status  01/02/2021 31.1 26.6 - 33.0 pg Final  07/18/2020 30.7 26.0 - 34.0 pg Final   MCV  Date Value Ref Range Status  01/02/2021 91 79 - 97 fL Final   No results found for: "PLTCOUNTKUC", "LABPLAT", "POCPLA" RDW  Date Value Ref Range Status  01/02/2021 13.1 11.6 - 15.4 % Final         Passed - HBA1C is between 0 and 7.9 and within 180 days    Hemoglobin A1C  Date Value Ref Range Status  03/27/2022 6.4 (A) 4.0 - 5.6 % Final   Hgb A1c MFr Bld  Date Value Ref Range Status  01/02/2021 6.8 (H) 4.8 - 5.6 % Final    Comment:             Prediabetes: 5.7 - 6.4          Diabetes: >6.4          Glycemic control for adults with diabetes: <7.0  Passed - eGFR in normal range and within 360 days    GFR calc Af Amer  Date Value Ref Range Status  09/28/2019 124 >59 mL/min/1.73 Final   GFR calc non Af Amer  Date Value Ref Range Status  07/18/2020 >60 >60 mL/min Final   eGFR  Date Value Ref Range Status  03/27/2022 108 >59 mL/min/1.73 Final         Passed - Valid encounter within last 6 months     Recent Outpatient Visits          1 month ago Type 2 diabetes mellitus with other specified complication, without long-term current use of insulin [facility])   [facility], Dionne Bucy, MD   9 months ago Hypertension associated with diabetes [facility])   [facility], Dionne Bucy, MD   1 year ago Encounter for annual physical exam   [facility], Dionne Bucy, MD   1 year ago Hypertension associated with diabetes [facility])   [facility], Dionne Bucy, MD   2 years ago Hypertension associated with diabetes [facility])   [place], Dionne Bucy, MD      Future Appointments            In 2 months Bacigalupo, Dionne Bucy, MD [facility], [place]   In 4 months Bacigalupo, Dionne Bucy, MD [facility], [facility]

## 2022-05-22 LAB — HM DIABETES EYE EXAM

## 2022-07-16 ENCOUNTER — Ambulatory Visit: Payer: 59 | Admitting: Family Medicine

## 2022-07-17 ENCOUNTER — Encounter: Payer: Self-pay | Admitting: Family Medicine

## 2022-07-17 ENCOUNTER — Ambulatory Visit (INDEPENDENT_AMBULATORY_CARE_PROVIDER_SITE_OTHER): Payer: 59 | Admitting: Family Medicine

## 2022-07-17 VITALS — BP 138/71 | HR 88 | Temp 97.8°F | Resp 20 | Wt 323.0 lb

## 2022-07-17 DIAGNOSIS — E1159 Type 2 diabetes mellitus with other circulatory complications: Secondary | ICD-10-CM | POA: Diagnosis not present

## 2022-07-17 DIAGNOSIS — F17200 Nicotine dependence, unspecified, uncomplicated: Secondary | ICD-10-CM | POA: Diagnosis not present

## 2022-07-17 DIAGNOSIS — I152 Hypertension secondary to endocrine disorders: Secondary | ICD-10-CM

## 2022-07-17 DIAGNOSIS — Z23 Encounter for immunization: Secondary | ICD-10-CM

## 2022-07-17 MED ORDER — LOSARTAN POTASSIUM 50 MG PO TABS: 50.0000 mg | ORAL_TABLET | Freq: Every day | ORAL | 1 refills | Status: DC

## 2022-07-17 NOTE — Assessment & Plan Note (Signed)
Discussed importance of healthy weight management Discussed diet and exercise  

## 2022-07-17 NOTE — Progress Notes (Signed)
I,Sulibeya S Dimas,acting as a Education administrator for Lavon Paganini, MD.,have documented all relevant documentation on the behalf of Lavon Paganini, MD,as directed by  Lavon Paganini, MD while in the presence of Lavon Paganini, MD.     Established patient visit   Patient: Dillon Bolton.   DOB: 1966-09-23   56 y.o. Male  MRN: [id number] Visit Date: 07/17/2022  Today's healthcare provider: Lavon Paganini, MD   Chief Complaint  Patient presents with   Hypertension   URI   Subjective    HPI HPI     URI           URI symptoms: congestion, cough, plugged ear sensation, rhinorrhea, sneezing and sore throat   Onset: in the past 7 days   Fever: temperature has been with in normal range   Smoker: a smoker       Last edited by Dorian Pod, [place] on 07/17/2022  9:46 AM.      Hypertension, follow-up  BP Readings from Last 3 Encounters:  07/17/22 (!) 154/78  03/27/22 (!) 148/76  08/04/21 131/70   Wt Readings from Last 3 Encounters:  07/17/22 (!) 323 lb (146.5 kg)  03/27/22 (!) 332 lb (150.6 kg)  08/04/21 (!) 338 lb 3.2 oz (153.4 kg)     He was last seen for hypertension 3 months ago.  BP at that visit was 148/76. Management since that visit includes no changes.  He reports excellent compliance with treatment. He is not having side effects.  He is following a Regular diet. He is not exercising. He does not smoke.  Use of agents associated with hypertension: none.   Outside blood pressures are not checked. Symptoms: No chest pain No chest pressure  No palpitations No syncope  No dyspnea No orthopnea  No paroxysmal nocturnal dyspnea No lower extremity edema   Pertinent labs Lab Results  Component Value Date   CHOL 130 03/27/2022   HDL 39 (L) 03/27/2022   LDLCALC 74 03/27/2022   TRIG 86 03/27/2022   CHOLHDL 3.3 03/27/2022   Lab Results  Component Value Date   NA 136 03/27/2022   K 4.3 03/27/2022   CREATININE 0.72 (L) 03/27/2022   EGFR  108 03/27/2022   GLUCOSE 149 (H) 03/27/2022     The 10-year ASCVD risk score (Arnett DK, et al., 2019) is: 21.2%  ---------------------------------------------------------------------------------------------------    Medications: Outpatient Medications Prior to Visit  Medication Sig   atorvastatin (LIPITOR) 40 MG tablet TAKE 1 TABLET BY MOUTH EVERY DAY   JARDIANCE 25 MG TABS tablet TAKE 1 TABLET BY MOUTH EVERY DAY   Meloxicam 15 MG TBDP Take 15 mg by mouth daily.    metFORMIN (GLUCOPHAGE) 1000 MG tablet TAKE 1 TABLET BY MOUTH 2 TIMES DAILY WITH A MEAL. PLEASE SCHEDULE OFFICE VISIT BEFORE ANY REFILL.   No facility-administered medications prior to visit.    Review of Systems  Constitutional:  Negative for chills and fever.  HENT:  Positive for congestion, rhinorrhea, sinus pressure and sore throat.   Respiratory:  Positive for cough. Negative for chest tightness, shortness of breath and wheezing.   Cardiovascular:  Negative for chest pain and palpitations.  Gastrointestinal:  Negative for abdominal pain, diarrhea, nausea and vomiting.       Objective    BP (!) 154/78 (BP Location: Left Arm, Patient Position: Sitting, Cuff Size: Large)   Pulse 94   Temp 97.8 F (36.6 C) (Oral)   Resp 20   Wt (!) 323 lb (146.5  kg)   SpO2 98%   BMI 42.61 kg/m  BP Readings from Last 3 Encounters:  07/17/22 (!) 151/75  03/27/22 (!) 148/76  08/04/21 131/70   Wt Readings from Last 3 Encounters:  07/17/22 (!) 323 lb (146.5 kg)  03/27/22 (!) 332 lb (150.6 kg)  08/04/21 (!) 338 lb 3.2 oz (153.4 kg)      Physical Exam Vitals reviewed.  Constitutional:      General: He is not in acute distress.    Appearance: Normal appearance. He is not diaphoretic.  HENT:     Head: Normocephalic and atraumatic.  Eyes:     General: No scleral icterus.    Conjunctiva/sclera: Conjunctivae normal.  Cardiovascular:     Rate and Rhythm: Normal rate and regular rhythm.     Pulses: Normal pulses.      Heart sounds: Normal heart sounds. No murmur heard. Pulmonary:     Effort: Pulmonary effort is normal. No respiratory distress.     Breath sounds: Normal breath sounds. No wheezing or rhonchi.  Musculoskeletal:     Cervical back: Neck supple.     Right lower leg: No edema.     Left lower leg: No edema.  Lymphadenopathy:     Cervical: No cervical adenopathy.  Skin:    General: Skin is warm and dry.     Findings: No rash.  Neurological:     Mental Status: He is alert and oriented to person, place, and time. Mental status is at baseline.  Psychiatric:        Mood and Affect: Mood normal.        Behavior: Behavior normal.       No results found for any visits on 07/17/22.  Assessment & Plan     Problem List Items Addressed This Visit       Cardiovascular and Mediastinum   Hypertension associated with diabetes ([place]) - Primary    Chronic and uncontrolled Has been hesitant to take medications in the past We did discuss the importance of treating blood pressure Encourage home blood pressure monitoring Start losartan 50 mg daily Check BMP in 2 weeks Follow-up in 2 months      Relevant Medications   losartan (COZAAR) 50 MG tablet   Other Relevant Orders   Basic Metabolic Panel (BMET)     Other   Morbid obesity ([place])    Discussed importance of healthy weight management Discussed diet and exercise       Tobacco use disorder    Patient remains pre-contemplative Encourage cessation      Other Visit Diagnoses     Need for immunization against influenza       Relevant Orders   Flu Vaccine QUAD 87moIM (Fluarix, Fluzone & Alfiuria Quad PF) (Completed)        No follow-ups on file.      I, ALavon Paganini MD, have reviewed all documentation for this visit. The documentation on 07/17/22 for the exam, diagnosis, procedures, and orders are all accurate and complete.   Bacigalupo, ADionne Bucy MD, MPH [facility]

## 2022-07-17 NOTE — Assessment & Plan Note (Signed)
Chronic and uncontrolled Has been hesitant to take medications in the past We did discuss the importance of treating blood pressure Encourage home blood pressure monitoring Start losartan 50 mg daily Check BMP in 2 weeks Follow-up in 2 months

## 2022-07-17 NOTE — Assessment & Plan Note (Signed)
Patient remains pre-contemplative Encourage cessation

## 2022-07-21 NOTE — Progress Notes (Unsigned)
Established patient visit   Patient: Dillon Bolton.   DOB: [birth year]   56 y.o. Male  MRN: [id number] Visit Date: 07/22/2022  Today's healthcare provider: Gwyneth Sprout, FNP  Introduced to nurse practitioner role and practice setting.  All questions answered.  Discussed provider/patient relationship and expectations.  I,Chasitee Zenker J Emmilia Sowder,acting as a scribe for Gwyneth Sprout, FNP.,have documented all relevant documentation on the behalf of Gwyneth Sprout, FNP,as directed by  Gwyneth Sprout, FNP while in the presence of Gwyneth Sprout, FNP.   Chief Complaint  Patient presents with   Ear Fullness    Patient complains of ear congestion for 5 days. States there was blood in his ear earlier in the week.    Subjective    HPI HPI     Ear Fullness    Additional comments: Patient complains of ear congestion for 5 days. States there was blood in his ear earlier in the week.       Last edited by Smitty Knudsen, CMA on 07/22/2022  8:22 AM.       Medications: Outpatient Medications Prior to Visit  Medication Sig   atorvastatin (LIPITOR) 40 MG tablet TAKE 1 TABLET BY MOUTH EVERY DAY   JARDIANCE 25 MG TABS tablet TAKE 1 TABLET BY MOUTH EVERY DAY   losartan (COZAAR) 50 MG tablet Take 1 tablet (50 mg total) by mouth daily.   Meloxicam 15 MG TBDP Take 15 mg by mouth daily.    metFORMIN (GLUCOPHAGE) 1000 MG tablet TAKE 1 TABLET BY MOUTH 2 TIMES DAILY WITH A MEAL. PLEASE SCHEDULE OFFICE VISIT BEFORE ANY REFILL.   No facility-administered medications prior to visit.    Review of Systems    Objective    BP (!) 144/88 (BP Location: Left Arm, Patient Position: Sitting, Cuff Size: Large)   Pulse 96   Resp 16   Ht '6\' 1"'$  (1.854 m)   Wt (!) 324 lb (147 kg)   SpO2 97%   BMI 42.75 kg/m   Physical Exam Vitals and nursing note reviewed.  Constitutional:      Appearance: Normal appearance. He is obese.  HENT:     Head: Normocephalic and atraumatic.     Right Ear: There is  impacted cerumen.     Left Ear: Drainage, swelling and tenderness present.     Ears:     Comments: Pt reports pus and blood discharge from L ear within the last 5 with initial congestion s/p home H2O2 use to assist Eyes:     Pupils: Pupils are equal, round, and reactive to light.  Neck:     Comments: +nodes full/tender under L ear Cardiovascular:     Rate and Rhythm: Normal rate and regular rhythm.     Pulses: Normal pulses.     Heart sounds: Normal heart sounds.  Pulmonary:     Effort: Pulmonary effort is normal.     Breath sounds: Normal breath sounds.  Musculoskeletal:        General: Normal range of motion.     Cervical back: Normal range of motion. Tenderness present.  Skin:    General: Skin is warm and dry.     Capillary Refill: Capillary refill takes less than 2 seconds.  Neurological:     General: No focal deficit present.     Mental Status: He is alert and oriented to person, place, and time. Mental status is at baseline.      No results  found for any visits on 07/22/22.  Assessment & Plan     Problem List Items Addressed This Visit       Nervous and Auditory   Excessive cerumen in ear canal, right    Cleansed with use of H2O2/water irrigation; pt tolerated procedure well. No instruments necessary.      Non-recurrent acute suppurative otitis media of left ear without spontaneous rupture of tympanic membrane - Primary    Likely due to excessive cerumen, as evidence by cerumen in R ear with impaction as well as inflicted trauma with home use of H2O2, undiluted and use of Q tips. Pt reports chronic use of in ear headphones as well Continue to drink water daily.  Goal of 64 oz/day minimum.  Can use OTC allergy medication, ex Zyrtec as well as nasal steroid, ex Flonase.  If you are congested you can use Saline Nasal Spray.  Use saline prior to using Flonase if needed.  You can use medications for cough like Delsym and for mucus, like Mucinex.  Can add lozenges or  drink tea with local honey to help relieve symptoms.       Relevant Medications   amoxicillin-clavulanate (AUGMENTIN) 875-125 MG tablet     Return if symptoms worsen or fail to improve.      Vonna Kotyk, FNP, have reviewed all documentation for this visit. The documentation on 07/22/22 for the exam, diagnosis, procedures, and orders are all accurate and complete.    Gwyneth Sprout, [place] [phone] (phone) [phone] (fax)  [place]

## 2022-07-22 ENCOUNTER — Encounter: Payer: Self-pay | Admitting: Family Medicine

## 2022-07-22 ENCOUNTER — Ambulatory Visit (INDEPENDENT_AMBULATORY_CARE_PROVIDER_SITE_OTHER): Payer: 59 | Admitting: Family Medicine

## 2022-07-22 VITALS — BP 144/88 | HR 96 | Resp 16 | Ht 73.0 in | Wt 324.0 lb

## 2022-07-22 DIAGNOSIS — H66002 Acute suppurative otitis media without spontaneous rupture of ear drum, left ear: Secondary | ICD-10-CM | POA: Diagnosis not present

## 2022-07-22 DIAGNOSIS — H6121 Impacted cerumen, right ear: Secondary | ICD-10-CM

## 2022-07-22 MED ORDER — AMOXICILLIN-POT CLAVULANATE 875-125 MG PO TABS: 1.0000 | ORAL_TABLET | Freq: Two times a day (BID) | ORAL | 0 refills | Status: DC

## 2022-07-22 NOTE — Assessment & Plan Note (Signed)
Cleansed with use of H2O2/water irrigation; pt tolerated procedure well. No instruments necessary.

## 2022-07-22 NOTE — Assessment & Plan Note (Signed)
Likely due to excessive cerumen, as evidence by cerumen in R ear with impaction as well as inflicted trauma with home use of H2O2, undiluted and use of Q tips. Pt reports chronic use of in ear headphones as well Continue to drink water daily.  Goal of 64 oz/day minimum.  Can use OTC allergy medication, ex Zyrtec as well as nasal steroid, ex Flonase.  If you are congested you can use Saline Nasal Spray.  Use saline prior to using Flonase if needed.  You can use medications for cough like Delsym and for mucus, like Mucinex.  Can add lozenges or drink tea with local honey to help relieve symptoms.

## 2022-08-06 ENCOUNTER — Other Ambulatory Visit: Payer: Self-pay | Admitting: Family Medicine

## 2022-09-28 NOTE — Progress Notes (Unsigned)
I,Dillon Bolton,acting as a Education administrator for Dillon Paganini, MD.,have documented all relevant documentation on the behalf of Dillon Paganini, MD,as directed by  Dillon Paganini, MD while in the presence of Dillon Paganini, MD.    Complete physical exam   Patient: Dillon Bolton.   DOB: [birth year]   56 y.o. Male  MRN: [id number] Visit Date: 10/01/2022  Today's healthcare provider: Lavon Paganini, MD   Chief Complaint  Patient presents with   Annual Exam   Subjective    Dillon Bolton. is a 56 y.o. male who presents today for a complete physical exam.  He reports consuming a general diet. The patient has a physically strenuous job, but has no regular exercise apart from work.  He generally feels well. He reports sleeping poorly. He does not have additional problems to discuss today.  HPI    Past Medical History:  Diagnosis Date   Diabetes mellitus without complication ([place])    Sleep apnea    Past Surgical History:  Procedure Laterality Date   COLONOSCOPY WITH PROPOFOL N/A 01/06/2018   Procedure: COLONOSCOPY WITH PROPOFOL;  Surgeon: Jonathon Bellows, MD;  Location: [facility];  Service: Gastroenterology;  Laterality: N/A;   CYSTOSCOPY WITH STENT PLACEMENT Left 07/19/2020   Procedure: CYSTOSCOPY WITH STENT PLACEMENT;  Surgeon: Abbie Sons, MD;  Location: [facility] ORS;  Service: Urology;  Laterality: Left;   CYSTOSCOPY/URETEROSCOPY/HOLMIUM LASER/STENT PLACEMENT Left 07/30/2020   Procedure: CYSTOSCOPY/URETEROSCOPY/HOLMIUM LASER/STENT EXCHANGE;  Surgeon: Abbie Sons, MD;  Location: [facility] ORS;  Service: Urology;  Laterality: Left;   KNEE SURGERY Bilateral 1975   removed osteochondromas at age 77   NOSE SURGERY     to help with OSA, opened up passages   TONSILLECTOMY     WISDOM TOOTH EXTRACTION     Social History   Socioeconomic History   Marital status: Married    Spouse name: Elana   Number of children: 3   Years of education: 14   Highest  education level: Associate degree: academic program  Occupational History   Occupation: works on Optometrist: OTHER    Comment: [employer]  Tobacco Use   Smoking status: Every Day    Packs/day: 0.50    Types: Cigarettes    Start date: 1987   Smokeless tobacco: Never   Tobacco comments:    started smoking at age 61. Is smoking 0.25-0.75 PPD, which is decreased from 1.5-2 PPD  Vaping Use   Vaping Use: Never used  Substance and Sexual Activity   Alcohol use: Yes    Alcohol/week: 1.0 - 2.0 standard drink of alcohol    Types: 1 - 2 Cans of beer per week    Comment: occasional   Drug use: No   Sexual activity: Yes    Partners: Female  Other Topics Concern   Not on file  Social History Narrative   Pt has 3 adopted children.   Social Determinants of Health   Financial Resource Strain: Low Risk  (11/09/2017)   Overall Financial Resource Strain (CARDIA)    Difficulty of Paying Living Expenses: Not hard at all  Food Insecurity: No Food Insecurity (11/09/2017)   Hunger Vital Sign    Worried About Running Out of Food in the Last Year: Never true    Ran Out of Food in the Last Year: Never true  Transportation Needs: No Transportation Needs (11/09/2017)   PRAPARE - Transportation    Lack of Transportation (Medical): No    Lack of  Transportation (Non-Medical): No  Physical Activity: Inactive (11/09/2017)   Exercise Vital Sign    Days of Exercise per Week: 0 days    Minutes of Exercise per Session: 0 min  Stress: Not on file  Social Connections: Not on file  Intimate Partner Violence: Not on file   Family Status  Relation Name Status   Mother  Alive   Father  Alive   Sister half sister Alive   Brother half brother Alive   Brother half brother Alive   Neg Hx  (Not Specified)   Family History  Problem Relation Age of Onset   Hypertension Mother    Colon cancer Father    Hypertension Brother    Prostate cancer Neg Hx    No Known Allergies  Patient Care  Team: Virginia Crews, MD as PCP - General (Family Medicine)   Medications: Outpatient Medications Prior to Visit  Medication Sig   atorvastatin (LIPITOR) 40 MG tablet TAKE 1 TABLET BY MOUTH EVERY DAY   JARDIANCE 25 MG TABS tablet TAKE 1 TABLET BY MOUTH EVERY DAY   losartan (COZAAR) 50 MG tablet Take 1 tablet (50 mg total) by mouth daily.   Meloxicam 15 MG TBDP Take 15 mg by mouth daily.    metFORMIN (GLUCOPHAGE) 1000 MG tablet TAKE 1 TABLET BY MOUTH 2 TIMES DAILY WITH A MEAL. PLEASE SCHEDULE OFFICE VISIT BEFORE ANY REFILL.   amoxicillin-clavulanate (AUGMENTIN) 875-125 MG tablet Take 1 tablet by mouth 2 (two) times daily. (Patient not taking: Reported on 10/01/2022)   No facility-administered medications prior to visit.    Review of Systems  All other systems reviewed and are negative.   Last CBC Lab Results  Component Value Date   WBC 9.1 01/02/2021   HGB 15.6 01/02/2021   HCT 45.8 01/02/2021   MCV 91 01/02/2021   MCH 31.1 01/02/2021   RDW 13.1 01/02/2021   PLT 218 67/20/9470   Last metabolic panel Lab Results  Component Value Date   GLUCOSE 149 (H) 03/27/2022   NA 136 03/27/2022   K 4.3 03/27/2022   CL 101 03/27/2022   CO2 21 03/27/2022   BUN 18 03/27/2022   CREATININE 0.72 (L) 03/27/2022   EGFR 108 03/27/2022   CALCIUM 9.5 03/27/2022   PROT 6.7 03/27/2022   ALBUMIN 4.3 03/27/2022   LABGLOB 2.4 03/27/2022   AGRATIO 1.8 03/27/2022   BILITOT 0.3 03/27/2022   ALKPHOS 95 03/27/2022   AST 17 03/27/2022   ALT 30 03/27/2022   ANIONGAP 11 07/18/2020   Last lipids Lab Results  Component Value Date   CHOL 130 03/27/2022   HDL 39 (L) 03/27/2022   LDLCALC 74 03/27/2022   TRIG 86 03/27/2022   CHOLHDL 3.3 03/27/2022   Last hemoglobin A1c Lab Results  Component Value Date   HGBA1C 6.4 (A) 03/27/2022      Objective    BP 120/77 (BP Location: Left Arm, Patient Position: Sitting, Cuff Size: Large)   Pulse 85   Temp 97.6 F (36.4 C) (Oral)   Resp 16   Ht  _0  (1.854 m)   Wt (!) 320 lb (145.2 kg)   BMI 42.22 kg/m  BP Readings from Last 3 Encounters:  10/01/22 120/77  07/22/22 (!) 144/88  07/17/22 138/71   Wt Readings from Last 3 Encounters:  10/01/22 (!) 320 lb (145.2 kg)  07/22/22 (!) 324 lb (147 kg)  07/17/22 (!) 323 lb (146.5 kg)       Physical Exam Vitals reviewed.  Constitutional:      General: He is not in acute distress.    Appearance: Normal appearance. He is well-developed. He is not diaphoretic.  HENT:     Head: Normocephalic and atraumatic.     Right Ear: Tympanic membrane, ear canal and external ear normal.     Left Ear: Tympanic membrane, ear canal and external ear normal.     Nose: Nose normal.     Mouth/Throat:     Mouth: Mucous membranes are moist.     Pharynx: Oropharynx is clear. No oropharyngeal exudate.  Eyes:     General: No scleral icterus.    Conjunctiva/sclera: Conjunctivae normal.     Pupils: Pupils are equal, round, and reactive to light.  Neck:     Thyroid: No thyromegaly.  Cardiovascular:     Rate and Rhythm: Normal rate and regular rhythm.     Pulses: Normal pulses.     Heart sounds: Normal heart sounds. No murmur heard. Pulmonary:     Effort: Pulmonary effort is normal. No respiratory distress.     Breath sounds: Normal breath sounds. No wheezing or rales.  Abdominal:     General: There is no distension.     Palpations: Abdomen is soft.     Tenderness: There is no abdominal tenderness.  Musculoskeletal:        General: No deformity.     Cervical back: Neck supple.     Right lower leg: No edema.     Left lower leg: No edema.  Lymphadenopathy:     Cervical: No cervical adenopathy.  Skin:    General: Skin is warm and dry.     Findings: No rash.  Neurological:     Mental Status: He is alert and oriented to person, place, and time. Mental status is at baseline.     Gait: Gait normal.  Psychiatric:        Mood and Affect: Mood normal.        Behavior: Behavior normal.         Thought Content: Thought content normal.       Last depression screening scores    10/01/2022    9:48 AM 07/22/2022    8:25 AM 07/17/2022   10:39 AM  PHQ 2/9 Scores  PHQ - 2 Score 0 1 1  PHQ- 9 Score _0 Last fall risk screening    10/01/2022    9:48 AM  Fall Risk   Falls in the past year? 1  Number falls in past yr: 0  Injury with Fall? 0  Risk for fall due to : No Fall Risks  Follow up Falls evaluation completed   Last Audit-C alcohol use screening    10/01/2022    9:49 AM  Alcohol Use Disorder Test (AUDIT)  1. How often do you have a drink containing alcohol? 2  2. How many drinks containing alcohol do you have on a typical day when you are drinking? 0  3. How often do you have six or more drinks on one occasion? 1  AUDIT-C Score 3   A score of 3 or more in women, and 4 or more in men indicates increased risk for alcohol abuse, EXCEPT if all of the points are from question 1   No results found for any visits on 10/01/22.  Assessment & Plan    Routine Health Maintenance and Physical Exam  Exercise Activities and Dietary recommendations  Goals   None  Immunization History  Administered Date(s) Administered   Influenza Inj Mdck Quad Pf 08/04/2018   Influenza Split 08/04/2015   Influenza,inj,Quad PF,6+ Mos 11/09/2017, 06/16/2019, 08/25/2020, 08/04/2021, 07/17/2022   Moderna Sars-Covid-2 Vaccination 02/19/2020, 03/18/2020   Pneumococcal Polysaccharide-23 05/17/2018   Tdap 11/09/2017   Zoster Recombinat (Shingrix) 09/28/2019, 01/02/2021    Health Maintenance  Topic Date Due   Lung Cancer Screening  Never done   COLONOSCOPY (Pts 45-53yr Insurance coverage will need to be confirmed)  01/06/2021   COVID-19 Vaccine (3 - 2023-24 season) 06/12/2022   FOOT EXAM  08/04/2022   HEMOGLOBIN A1C  09/26/2022   Diabetic kidney evaluation - eGFR measurement  03/28/2023   Diabetic kidney evaluation - Urine ACR  03/28/2023   OPHTHALMOLOGY EXAM  05/23/2023    DTaP/Tdap/Td (2 - Td or Tdap) 11/10/2027   INFLUENZA VACCINE  Completed   Hepatitis C Screening  Completed   HIV Screening  Completed   Zoster Vaccines- Shingrix  Completed   HPV VACCINES  Aged Out    Discussed health benefits of physical activity, and encouraged him to engage in regular exercise appropriate for his age and condition.  Problem List Items Addressed This Visit       Cardiovascular and Mediastinum   Hypertension associated with diabetes ([place]    Well controlled Continue current medications Recheck metabolic panel F/u in 6 months       Relevant Orders   Comprehensive metabolic panel     Endocrine   Hyperlipidemia associated with type 2 diabetes mellitus ([place]    Previously well controlled Continue statin Repeat FLP and CMP Goal LDL < 70      Relevant Orders   Lipid Panel With LDL/HDL Ratio   T2DM (type 2 diabetes mellitus) ([place]    Well controlled with last A1c 6.4 Repeat A1c Continue current medications UTD on vaccines, eye exam, foot exam (completed today) On ACEi/ARB On Statin Discussed diet and exercise F/u in 6 months       Relevant Orders   Hemoglobin A1c     Other   Morbid obesity ([place]    Discussed importance of healthy weight management Discussed diet and exercise       Tobacco use disorder    Not ready to quit Lung cancer screening      Relevant Orders   CT CHEST LUNG CA SCREEN LOW DOSE W/O CM   Other Visit Diagnoses     Encounter for annual physical exam    -  Primary   Relevant Orders   Comprehensive metabolic panel   Lipid Panel With LDL/HDL Ratio   Hemoglobin A1c   Colon cancer screening       Relevant Orders   Ambulatory referral to Gastroenterology   Encounter for screening for lung cancer       Relevant Orders   CT CHEST LUNG CA SCREEN LOW DOSE W/O CM        Return in about 6 months (around 04/02/2023) for chronic disease f/u.     I, ALavon Paganini MD, have reviewed all documentation for this visit. The  documentation on 10/01/22 for the exam, diagnosis, procedures, and orders are all accurate and complete.   Bacigalupo, ADionne Bucy MD, MPH [facility]

## 2022-10-01 ENCOUNTER — Encounter: Payer: Self-pay | Admitting: Family Medicine

## 2022-10-01 ENCOUNTER — Ambulatory Visit (INDEPENDENT_AMBULATORY_CARE_PROVIDER_SITE_OTHER): Payer: 59 | Admitting: Family Medicine

## 2022-10-01 VITALS — BP 120/77 | HR 85 | Temp 97.6°F | Resp 16 | Ht 73.0 in | Wt 320.0 lb

## 2022-10-01 DIAGNOSIS — E1169 Type 2 diabetes mellitus with other specified complication: Secondary | ICD-10-CM | POA: Diagnosis not present

## 2022-10-01 DIAGNOSIS — Z1211 Encounter for screening for malignant neoplasm of colon: Secondary | ICD-10-CM

## 2022-10-01 DIAGNOSIS — E1159 Type 2 diabetes mellitus with other circulatory complications: Secondary | ICD-10-CM

## 2022-10-01 DIAGNOSIS — F17200 Nicotine dependence, unspecified, uncomplicated: Secondary | ICD-10-CM

## 2022-10-01 DIAGNOSIS — E785 Hyperlipidemia, unspecified: Secondary | ICD-10-CM

## 2022-10-01 DIAGNOSIS — Z0000 Encounter for general adult medical examination without abnormal findings: Secondary | ICD-10-CM | POA: Diagnosis not present

## 2022-10-01 DIAGNOSIS — I152 Hypertension secondary to endocrine disorders: Secondary | ICD-10-CM

## 2022-10-01 DIAGNOSIS — Z122 Encounter for screening for malignant neoplasm of respiratory organs: Secondary | ICD-10-CM

## 2022-10-01 NOTE — Assessment & Plan Note (Signed)
Not ready to quit Lung cancer screening

## 2022-10-01 NOTE — Assessment & Plan Note (Signed)
Well controlled with last A1c 6.4 Repeat A1c Continue current medications UTD on vaccines, eye exam, foot exam (completed today) On ACEi/ARB On Statin Discussed diet and exercise F/u in 6 months

## 2022-10-01 NOTE — Assessment & Plan Note (Signed)
Well controlled Continue current medications Recheck metabolic panel F/u in 6 months  

## 2022-10-01 NOTE — Assessment & Plan Note (Signed)
Discussed importance of healthy weight management Discussed diet and exercise  

## 2022-10-01 NOTE — Assessment & Plan Note (Signed)
Previously well controlled Continue statin Repeat FLP and CMP Goal LDL < 70 

## 2022-10-02 LAB — COMPREHENSIVE METABOLIC PANEL
ALT: 30 IU/L (ref 0–44)
AST: 18 IU/L (ref 0–40)
Albumin/Globulin Ratio: 1.6 (ref 1.2–2.2)
Albumin: 4.1 g/dL (ref 3.8–4.9)
Alkaline Phosphatase: 90 IU/L (ref 44–121)
BUN/Creatinine Ratio: 12 (ref 9–20)
BUN: 8 mg/dL (ref 6–24)
Bilirubin Total: 0.4 mg/dL (ref 0.0–1.2)
CO2: 21 mmol/L (ref 20–29)
Calcium: 9.6 mg/dL (ref 8.7–10.2)
Chloride: 103 mmol/L (ref 96–106)
Creatinine, Ser: 0.69 mg/dL — ABNORMAL LOW (ref 0.76–1.27)
Globulin, Total: 2.6 g/dL (ref 1.5–4.5)
Glucose: 144 mg/dL — ABNORMAL HIGH (ref 70–99)
Potassium: 4.3 mmol/L (ref 3.5–5.2)
Sodium: 140 mmol/L (ref 134–144)
Total Protein: 6.7 g/dL (ref 6.0–8.5)
eGFR: 109 mL/min/{1.73_m2} (ref >59)

## 2022-10-02 LAB — LIPID PANEL WITH LDL/HDL RATIO
Cholesterol, Total: 134 mg/dL (ref 100–199)
HDL: 37 mg/dL — ABNORMAL LOW (ref >39)
LDL Chol Calc (NIH): 76 mg/dL (ref 0–99)
LDL/HDL Ratio: 2.1 ratio (ref 0.0–3.6)
Triglycerides: 116 mg/dL (ref 0–149)
VLDL Cholesterol Cal: 21 mg/dL (ref 5–40)

## 2022-10-02 LAB — HEMOGLOBIN A1C
Est. average glucose Bld gHb Est-mCnc: 174 mg/dL
Hgb A1c MFr Bld: 7.7 % — ABNORMAL HIGH (ref 4.8–5.6)

## 2022-10-14 ENCOUNTER — Telehealth: Payer: Self-pay

## 2022-10-14 ENCOUNTER — Other Ambulatory Visit: Payer: Self-pay

## 2022-10-14 DIAGNOSIS — Z86010 Personal history of colonic polyps: Secondary | ICD-10-CM

## 2022-10-14 MED ORDER — NA SULFATE-K SULFATE-MG SULF 17.5-3.13-1.6 GM/177ML PO SOLN: 1.0000 | Freq: Once | ORAL | 0 refills | Status: AC

## 2022-10-14 NOTE — Telephone Encounter (Signed)
Gastroenterology Pre-Procedure Review  Request Date: 11/26/22 Requesting Physician: Dr. Vicente Males  PATIENT REVIEW QUESTIONS: The patient responded to the following health history questions as indicated:    1. Are you having any GI issues? no 2. Do you have a personal history of Polyps? no 3. Do you have a family history of Colon Cancer or Polyps? yes (last colonoscopy performed by Dr. Vicente Males 01/06/18) 4. Diabetes Mellitus? yes (Patient advised to stop takiing metformin and jardiance on 02/12) 5. Joint replacements in the past 12 months?no 6. Major health problems in the past 3 months?no 7. Any artificial heart valves, MVP, or defibrillator?no    MEDICATIONS & ALLERGIES:    Patient reports the following regarding taking any anticoagulation/antiplatelet therapy:   Plavix, Coumadin, Eliquis, Xarelto, Lovenox, Pradaxa, Brilinta, or Effient? no Aspirin? no  Patient confirms/reports the following medications:  Current Outpatient Medications  Medication Sig Dispense Refill   amoxicillin-clavulanate (AUGMENTIN) 875-125 MG tablet Take 1 tablet by mouth 2 (two) times daily. (Patient not taking: Reported on 10/01/2022) 20 tablet 0   atorvastatin (LIPITOR) 40 MG tablet TAKE 1 TABLET BY MOUTH EVERY DAY 30 tablet 2   JARDIANCE 25 MG TABS tablet TAKE 1 TABLET BY MOUTH EVERY DAY 30 tablet 2   losartan (COZAAR) 50 MG tablet Take 1 tablet (50 mg total) by mouth daily. 90 tablet 1   Meloxicam 15 MG TBDP Take 15 mg by mouth daily.      metFORMIN (GLUCOPHAGE) 1000 MG tablet TAKE 1 TABLET BY MOUTH 2 TIMES DAILY WITH A MEAL. PLEASE SCHEDULE OFFICE VISIT BEFORE ANY REFILL. 60 tablet 2   No current facility-administered medications for this visit.    Patient confirms/reports the following allergies:  No Known Allergies  No orders of the defined types were placed in this encounter.   AUTHORIZATION INFORMATION Primary Insurance: 1D#: Group #:  Secondary Insurance: 1D#: Group #:  SCHEDULE  INFORMATION: Date: 11/26/22 Time: Location: [facility]

## 2022-10-19 ENCOUNTER — Telehealth: Payer: Self-pay

## 2022-10-19 DIAGNOSIS — Z86010 Personal history of colonic polyps: Secondary | ICD-10-CM

## 2022-10-19 NOTE — Telephone Encounter (Signed)
Voice message has been left for patient to call office in re: 11/26/22 colonoscopy with Dr. Vicente Males.  We will need to reschedule.  Dr. Vicente Males will not be available on this date.  Thanks,  [place], [place]

## 2022-10-20 NOTE — Telephone Encounter (Signed)
Voice message has been left for patient to call office in re: 11/26/22 colonoscopy with Dr. Vicente Males. We will need to reschedule. Dr. Vicente Males will not be available on this date.   Thanks,  [place], [place]

## 2022-10-29 ENCOUNTER — Other Ambulatory Visit: Payer: Self-pay

## 2022-10-29 ENCOUNTER — Other Ambulatory Visit: Payer: Self-pay | Admitting: Family Medicine

## 2022-11-06 NOTE — Telephone Encounter (Signed)
Pt returned call. He has been reschedule to 12/01/22 with Dr. Vicente Males.  Thanks, [place], [place]

## 2022-11-30 ENCOUNTER — Ambulatory Visit: Payer: 59 | Admitting: Family Medicine

## 2022-11-30 ENCOUNTER — Encounter: Payer: Self-pay | Admitting: Gastroenterology

## 2022-12-01 ENCOUNTER — Ambulatory Visit
Admission: RE | Admit: 2022-12-01 | Discharge: 2022-12-01 | Disposition: A | Discharge status: Home / Self Care | Payer: 59 | Attending: Gastroenterology | Admitting: Gastroenterology

## 2022-12-01 ENCOUNTER — Encounter
Admission: RE | Disposition: A | Discharge status: Home / Self Care | Payer: Self-pay | Source: Home / Self Care | Attending: Gastroenterology

## 2022-12-01 ENCOUNTER — Ambulatory Visit: Payer: 59 | Admitting: Anesthesiology

## 2022-12-01 ENCOUNTER — Encounter: Payer: Self-pay | Admitting: Gastroenterology

## 2022-12-01 DIAGNOSIS — D126 Benign neoplasm of colon, unspecified: Secondary | ICD-10-CM | POA: Diagnosis not present

## 2022-12-01 DIAGNOSIS — F17200 Nicotine dependence, unspecified, uncomplicated: Secondary | ICD-10-CM | POA: Diagnosis not present

## 2022-12-01 DIAGNOSIS — Z6841 Body Mass Index (BMI) 40.0 and over, adult: Secondary | ICD-10-CM | POA: Insufficient documentation

## 2022-12-01 DIAGNOSIS — D122 Benign neoplasm of ascending colon: Secondary | ICD-10-CM | POA: Diagnosis not present

## 2022-12-01 DIAGNOSIS — D120 Benign neoplasm of cecum: Secondary | ICD-10-CM | POA: Diagnosis not present

## 2022-12-01 DIAGNOSIS — Z860100 Personal history of colon polyps, unspecified: Secondary | ICD-10-CM

## 2022-12-01 DIAGNOSIS — I10 Essential (primary) hypertension: Secondary | ICD-10-CM | POA: Insufficient documentation

## 2022-12-01 DIAGNOSIS — Z1211 Encounter for screening for malignant neoplasm of colon: Secondary | ICD-10-CM | POA: Diagnosis not present

## 2022-12-01 DIAGNOSIS — D124 Benign neoplasm of descending colon: Secondary | ICD-10-CM | POA: Insufficient documentation

## 2022-12-01 DIAGNOSIS — E119 Type 2 diabetes mellitus without complications: Secondary | ICD-10-CM | POA: Diagnosis not present

## 2022-12-01 DIAGNOSIS — G4730 Sleep apnea, unspecified: Secondary | ICD-10-CM | POA: Diagnosis not present

## 2022-12-01 HISTORY — PX: COLONOSCOPY WITH PROPOFOL: SHX5780

## 2022-12-01 LAB — GLUCOSE, CAPILLARY: Glucose-Capillary: 166 mg/dL — ABNORMAL HIGH (ref 70–99)

## 2022-12-01 SURGERY — COLONOSCOPY WITH PROPOFOL
Anesthesia: General

## 2022-12-01 MED ORDER — LIDOCAINE HCL (CARDIAC) PF 100 MG/5ML IV SOSY
PREFILLED_SYRINGE | INTRAVENOUS | Status: DC | PRN
  Administered 2022-12-01: 50 mg via INTRAVENOUS

## 2022-12-01 MED ORDER — PROPOFOL 500 MG/50ML IV EMUL
INTRAVENOUS | Status: DC | PRN
  Administered 2022-12-01: 185 ug/kg/min via INTRAVENOUS

## 2022-12-01 MED ORDER — DEXMEDETOMIDINE HCL IN NACL 80 MCG/20ML IV SOLN
INTRAVENOUS | Status: DC | PRN
  Administered 2022-12-01: 12 ug via BUCCAL

## 2022-12-01 MED ORDER — SODIUM CHLORIDE 0.9 % IV SOLN
INTRAVENOUS | Status: DC
  Administered 2022-12-01: 1000 mL via INTRAVENOUS

## 2022-12-01 MED ORDER — PROPOFOL 10 MG/ML IV BOLUS
INTRAVENOUS | Status: DC | PRN
  Administered 2022-12-01: 100 mg via INTRAVENOUS

## 2022-12-01 NOTE — Anesthesia Postprocedure Evaluation (Signed)
Anesthesia Post Note  Patient: Dillon Bolton.  Procedure(s) Performed: COLONOSCOPY WITH PROPOFOL  Patient location during evaluation: Endoscopy Anesthesia Type: General Level of consciousness: awake and alert Pain management: pain level controlled Vital Signs Assessment: post-procedure vital signs reviewed and stable Respiratory status: spontaneous breathing, nonlabored ventilation, respiratory function stable and patient connected to nasal cannula oxygen Cardiovascular status: blood pressure returned to baseline and stable Postop Assessment: no apparent nausea or vomiting Anesthetic complications: no   No notable events documented.   Last Vitals:  Vitals:   12/01/22 0843 12/01/22 0853  BP: 124/69 124/76  Pulse: 100 86  Resp: (!) 24 (!) 26  Temp: (!) 36.1 C   SpO2: 94% 95%    Last Pain:  Vitals:   12/01/22 0853  TempSrc:   PainSc: 0-No pain                 Arita Miss

## 2022-12-01 NOTE — Anesthesia Preprocedure Evaluation (Signed)
Anesthesia Evaluation  Patient identified by MRN, date of birth, ID band Patient awake    Reviewed: Allergy & Precautions, NPO status , Patient's Chart, lab work & pertinent test results  History of Anesthesia Complications Negative for: history of anesthetic complications  Airway Mallampati: IV  TM Distance: >3 FB Neck ROM: Full    Dental no notable dental hx. (+) Teeth Intact   Pulmonary sleep apnea and Continuous Positive Airway Pressure Ventilation , neg COPD, Current SmokerPatient did not abstain from smoking.   Pulmonary exam normal breath sounds clear to auscultation       Cardiovascular Exercise Tolerance: Good METShypertension, (-) CAD and (-) Past MI (-) dysrhythmias  Rhythm:Regular Rate:Normal - Systolic murmurs    Neuro/Psych negative neurological ROS  negative psych ROS   GI/Hepatic ,neg GERD  ,,(+)     (-) substance abuse    Endo/Other  diabetes  Morbid obesity  Renal/GU negative Renal ROS     Musculoskeletal   Abdominal  (+) + obese  Peds  Hematology   Anesthesia Other Findings Past Medical History: No date: Diabetes mellitus without complication (HCC) No date: Sleep apnea  Reproductive/Obstetrics                             Anesthesia Physical Anesthesia Plan  ASA: 3  Anesthesia Plan: General   Post-op Pain Management: Minimal or no pain anticipated   Induction: Intravenous  PONV Risk Score and Plan: 1 and Propofol infusion, TIVA and Ondansetron  Airway Management Planned: Nasal Cannula  Additional Equipment: None  Intra-op Plan:   Post-operative Plan:   Informed Consent: I have reviewed the patients History and Physical, chart, labs and discussed the procedure including the risks, benefits and alternatives for the proposed anesthesia with the patient or authorized representative who has indicated his/her understanding and acceptance.     Dental  advisory given  Plan Discussed with: CRNA and Surgeon  Anesthesia Plan Comments: (Discussed risks of anesthesia with patient, including possibility of difficulty with spontaneous ventilation under anesthesia necessitating airway intervention, PONV, and rare risks such as cardiac or respiratory or neurological events, and allergic reactions. Discussed the role of CRNA in patient's perioperative care. Patient understands. Patient informed about increased incidence of above perioperative risk due to high BMI. Patient understands.  Patient counseled on benefits of smoking cessation, and increased perioperative risks associated with continued smoking. )       Anesthesia Quick Evaluation

## 2022-12-01 NOTE — Transfer of Care (Signed)
Immediate Anesthesia Transfer of Care Note  Patient: Dillon Bolton.  Procedure(s) Performed: COLONOSCOPY WITH PROPOFOL  Patient Location: PACU  Anesthesia Type:General  Level of Consciousness: drowsy  Airway & Oxygen Therapy: Patient Spontanous Breathing  Post-op Assessment: Report given to RN  Post vital signs: stable  Last Vitals:  Vitals Value Taken Time  BP    Temp    Pulse    Resp    SpO2      Last Pain:  Vitals:   12/01/22 0750  TempSrc: Temporal  PainSc: 0-No pain         Complications: No notable events documented.

## 2022-12-01 NOTE — Op Note (Signed)
[facility] Gastroenterology Patient Name: Dillon Bolton Procedure Date: 12/01/2022 8:00 AM MRN: [id number] Account #: [id number] Date of Birth: [birth year] Admit Type: Outpatient Age: 57 Room: [facility] ENDO ROOM 2 Gender: Male Note Status: Finalized Instrument Name: Jasper Riling [id number] Procedure:             Colonoscopy Indications:           Surveillance: Personal history of adenomatous polyps                         on last colonoscopy > 3 years ago Providers:             Jonathon Bellows MD, MD Referring MD:          Dionne Bucy. Bacigalupo (Referring MD) Medicines:             Monitored Anesthesia Care Complications:         No immediate complications. Procedure:             Pre-Anesthesia Assessment:                        - Prior to the procedure, a History and Physical was                         performed, and patient medications, allergies and                         sensitivities were reviewed. The patient's tolerance                         of previous anesthesia was reviewed.                        - The risks and benefits of the procedure and the                         sedation options and risks were discussed with the                         patient. All questions were answered and informed                         consent was obtained.                        - ASA Grade Assessment: II - A patient with mild                         systemic disease.                        After obtaining informed consent, the colonoscope was                         passed under direct vision. Throughout the procedure,                         the patient's blood pressure, pulse, and oxygen  saturations were monitored continuously. The                         Colonoscope was introduced through the anus and                         advanced to the the cecum, identified by the                         appendiceal orifice. The colonoscopy was performed                          with ease. The patient tolerated the procedure well.                         The quality of the bowel preparation was excellent.                         The ileocecal valve, appendiceal orifice, and rectum                         were photographed. Findings:      The perianal and digital rectal examinations were normal.      Six sessile polyps were found in the descending colon. The polyps were 5       to 8 mm in size. These polyps were removed with a cold snare. Resection       and retrieval were complete.      A 5 mm polyp was found in the ascending colon. The polyp was sessile.       The polyp was removed with a cold snare. Resection and retrieval were       complete.      An 8 mm polyp was found in the cecum. The polyp was sessile. The polyp       was removed with a cold snare. Resection and retrieval were complete.      The exam was otherwise without abnormality on direct and retroflexion       views. Impression:            - Six 5 to 8 mm polyps in the descending colon,                         removed with a cold snare. Resected and retrieved.                        - One 5 mm polyp in the ascending colon, removed with                         a cold snare. Resected and retrieved.                        - One 8 mm polyp in the cecum, removed with a cold                         snare. Resected and retrieved.                        - The examination was otherwise normal on direct and  retroflexion views. Recommendation:        - Discharge patient to home (with escort).                        - Resume previous diet.                        - Continue present medications.                        - Await pathology results.                        - Repeat colonoscopy in 3 years for surveillance. Procedure Code(s):     --- Professional ---                        [phone], Colonoscopy, flexible; with removal of                         tumor(s), polyp(s), or  other lesion(s) by snare                         technique Diagnosis Code(s):     --- Professional ---                        Z86.010, Personal history of colonic polyps                        D12.4, Benign neoplasm of descending colon                        D12.2, Benign neoplasm of ascending colon                        D12.0, Benign neoplasm of cecum CPT copyright 2022 American Medical Association. All rights reserved. The codes documented in this report are preliminary and upon coder review may  be revised to meet current compliance requirements. Jonathon Bellows, MD Jonathon Bellows MD, MD 12/01/2022 8:40:54 AM This report has been signed electronically. Number of Addenda: 0 Note Initiated On: 12/01/2022 8:00 AM Scope Withdrawal Time: 0 hours 17 minutes 31 seconds  Total Procedure Duration: 0 hours 18 minutes 55 seconds  Estimated Blood Loss:  Estimated blood loss: none.      [facility]

## 2022-12-01 NOTE — H&P (Signed)
Dillon Bellows, MD [address] [place], [place], [place], [address] [address], [place], [place], [place], [address] Phone: [phone]  Fax: [phone]  Primary Care Physician:  Virginia Crews, MD   Pre-Procedure History & Physical: HPI:  Dillon Bolton. is a 57 y.o. male is here for an colonoscopy.   Past Medical History:  Diagnosis Date   Diabetes mellitus without complication ([place])    Sleep apnea     Past Surgical History:  Procedure Laterality Date   COLONOSCOPY WITH PROPOFOL N/A 01/06/2018   Procedure: COLONOSCOPY WITH PROPOFOL;  Surgeon: Dillon Bellows, MD;  Location: [facility] ENDOSCOPY;  Service: Gastroenterology;  Laterality: N/A;   CYSTOSCOPY WITH STENT PLACEMENT Left 07/19/2020   Procedure: CYSTOSCOPY WITH STENT PLACEMENT;  Surgeon: Abbie Sons, MD;  Location: [facility] ORS;  Service: Urology;  Laterality: Left;   CYSTOSCOPY/URETEROSCOPY/HOLMIUM LASER/STENT PLACEMENT Left 07/30/2020   Procedure: CYSTOSCOPY/URETEROSCOPY/HOLMIUM LASER/STENT EXCHANGE;  Surgeon: Abbie Sons, MD;  Location: [facility] ORS;  Service: Urology;  Laterality: Left;   KNEE SURGERY Bilateral 1975   removed osteochondromas at age 54   NOSE SURGERY     to help with OSA, opened up passages   TONSILLECTOMY     WISDOM TOOTH EXTRACTION      Prior to Admission medications   Medication Sig Start Date End Date Taking? Authorizing Provider  atorvastatin (LIPITOR) 40 MG tablet TAKE 1 TABLET BY MOUTH EVERY DAY 10/29/22  Yes Bacigalupo, Dionne Bucy, MD  JARDIANCE 25 MG TABS tablet TAKE 1 TABLET BY MOUTH EVERY DAY 10/29/22  Yes Bacigalupo, Dionne Bucy, MD  losartan (COZAAR) 50 MG tablet Take 1 tablet (50 mg total) by mouth daily. 07/17/22  Yes Bacigalupo, Dionne Bucy, MD  Meloxicam 15 MG TBDP Take 15 mg by mouth daily.    Yes [provider]  metFORMIN (GLUCOPHAGE) 1000 MG tablet TAKE 1 TABLET BY MOUTH 2 TIMES DAILY WITH A MEAL. PLEASE SCHEDULE OFFICE VISIT BEFORE ANY REFILL. 10/29/22  Yes Virginia Crews, MD    Allergies as of 10/14/2022   (No Known Allergies)    Family History  Problem Relation Age of Onset   Hypertension Mother    Colon cancer Father    Hypertension Brother    Prostate cancer Neg Hx     Social History   Socioeconomic History   Marital status: Married    Spouse name: Elana   Number of children: 3   Years of education: 14   Highest education level: Associate degree: academic program  Occupational History   Occupation: works on [employer]: OTHER    Comment: [employer]  Tobacco Use   Smoking status: Every Day    Packs/day: 0.50    Types: Cigarettes    Start date: 1987   Smokeless tobacco: Never   Tobacco comments:    started smoking at age 55. Is smoking 0.25-0.75 PPD, which is decreased from 1.5-2 PPD  Vaping Use   Vaping Use: Never used  Substance and Sexual Activity   Alcohol use: Yes    Alcohol/week: 1.0 - 2.0 standard drink of alcohol    Types: 1 - 2 Cans of beer per week    Comment: occasional   Drug use: No   Sexual activity: Yes    Partners: Female  Other Topics Concern   Not on file  Social History Narrative   Pt has 3 adopted children.   Social Determinants of Health   Financial Resource Strain: Low Risk  (11/09/2017)  Overall Financial Resource Strain (CARDIA)    Difficulty of Paying Living Expenses: Not hard at all  Food Insecurity: No Food Insecurity (11/09/2017)   Hunger Vital Sign    Worried About Running Out of Food in the Last Year: Never true    Ran Out of Food in the Last Year: Never true  Transportation Needs: No Transportation Needs (11/09/2017)   PRAPARE - Hydrologist (Medical): No    Lack of Transportation (Non-Medical): No  Physical Activity: Inactive (11/09/2017)   Exercise Vital Sign    Days of Exercise per Week: 0 days    Minutes of Exercise per Session: 0 min  Stress: Not on file  Social Connections: Not on file  Intimate Partner Violence: Not on file    Review  of Systems: See HPI, otherwise negative ROS  Physical Exam: BP (!) 140/64   Pulse 83   Temp (!) 97 F (36.1 C) (Temporal)   Resp 18   Ht 6' 1"$  (1.854 m)   Wt (!) 141.4 kg   SpO2 100%   BMI 41.12 kg/m  General:   Alert,  pleasant and cooperative in NAD Head:  Normocephalic and atraumatic. Neck:  Supple; no masses or thyromegaly. Lungs:  Clear throughout to auscultation, normal respiratory effort.    Heart:  +S1, +S2, Regular rate and rhythm, No edema. Abdomen:  Soft, nontender and nondistended. Normal bowel sounds, without guarding, and without rebound.   Neurologic:  Alert and  oriented x4;  grossly normal neurologically.  Impression/Plan: [name]. is here for an colonoscopy to be performed for surveillance due to prior history of colon polyps   Risks, benefits, limitations, and alternatives regarding  colonoscopy have been reviewed with the patient.  Questions have been answered.  All parties agreeable.   Dillon Bellows, MD  12/01/2022, 8:07 AM

## 2022-12-02 ENCOUNTER — Encounter: Payer: Self-pay | Admitting: Gastroenterology

## 2022-12-03 ENCOUNTER — Encounter: Payer: Self-pay | Admitting: Gastroenterology

## 2022-12-03 LAB — SURGICAL PATHOLOGY

## 2022-12-04 ENCOUNTER — Ambulatory Visit (INDEPENDENT_AMBULATORY_CARE_PROVIDER_SITE_OTHER): Payer: 59 | Admitting: Family Medicine

## 2022-12-04 ENCOUNTER — Encounter: Payer: Self-pay | Admitting: Family Medicine

## 2022-12-04 ENCOUNTER — Other Ambulatory Visit: Payer: Self-pay | Admitting: Family Medicine

## 2022-12-04 VITALS — BP 115/76 | HR 73 | Temp 97.6°F | Resp 16 | Ht 72.0 in | Wt 313.6 lb

## 2022-12-04 DIAGNOSIS — G4733 Obstructive sleep apnea (adult) (pediatric): Secondary | ICD-10-CM

## 2022-12-04 DIAGNOSIS — E1169 Type 2 diabetes mellitus with other specified complication: Secondary | ICD-10-CM | POA: Diagnosis not present

## 2022-12-04 NOTE — Assessment & Plan Note (Signed)
Will plan to recheck A1c at next visit and determine need for additional medication He is hoping to see his A1c is improved and he does not need to add something like Ozempic

## 2022-12-04 NOTE — Progress Notes (Signed)
I,Sulibeya S Dimas,acting as a Education administrator for Lavon Paganini, MD.,have documented all relevant documentation on the behalf of Lavon Paganini, MD,as directed by  Lavon Paganini, MD while in the presence of Lavon Paganini, MD.     Established patient visit   Patient: [facility].   DOB: 21-Jun-1966   57 y.o. Male  MRN: [id number] Visit Date: 12/04/2022  Today's healthcare provider: Lavon Paganini, MD   Chief Complaint  Patient presents with   Diabetes   Sleep Apnea   Subjective    HPI  Diabetes Mellitus Type II, Follow-up  Lab Results  Component Value Date   HGBA1C 7.7 (H) 10/01/2022   HGBA1C 6.4 (A) 03/27/2022   HGBA1C 7.2 (A) 08/04/2021   Wt Readings from Last 3 Encounters:  12/04/22 (!) 313 lb 9.6 oz (142.2 kg)  12/01/22 (!) 311 lb 10.3 oz (141.4 kg)  10/01/22 (!) 320 lb (145.2 kg)   Last seen for diabetes 2 months ago.  Management since then includes labs checked. Patient advised to take medications daily. Or consider adding Ozempic.  Patient declined to add medication on 10/01/22.   He reports excellent compliance with treatment. He is not having side effects.   Home blood sugar records:  not being checked  Episodes of hypoglycemia? No   Current insulin regiment: none  Pertinent Labs: Lab Results  Component Value Date   CHOL 134 10/01/2022   HDL 37 (L) 10/01/2022   LDLCALC 76 10/01/2022   TRIG 116 10/01/2022   CHOLHDL 3.3 03/27/2022   Lab Results  Component Value Date   NA 140 10/01/2022   K 4.3 10/01/2022   CREATININE 0.69 (L) 10/01/2022   EGFR 109 10/01/2022   LABMICR 10.3 03/27/2022   MICRALBCREAT 24 03/27/2022     --------------------------------------------------------------------------------------------------- Follow up for sleep apnea  Patient requesting prescription for new CPAP machine. CPAP sounds like motor is dying.  Last CPAP >77 years old.  He reports excellent compliance with treatment. He reports using CPAP  machine every night.  He reports working night shift and reports sleeping about 4-5 hours during the day.  ----------------------------------------------------------------------------------------   Medications: Outpatient Medications Prior to Visit  Medication Sig   atorvastatin (LIPITOR) 40 MG tablet TAKE 1 TABLET BY MOUTH EVERY DAY   JARDIANCE 25 MG TABS tablet TAKE 1 TABLET BY MOUTH EVERY DAY   losartan (COZAAR) 50 MG tablet TAKE 1 TABLET BY MOUTH EVERY DAY   Meloxicam 15 MG TBDP Take 15 mg by mouth daily.    metFORMIN (GLUCOPHAGE) 1000 MG tablet TAKE 1 TABLET BY MOUTH 2 TIMES DAILY WITH A MEAL. PLEASE SCHEDULE OFFICE VISIT BEFORE ANY REFILL.   No facility-administered medications prior to visit.    Review of Systems  Constitutional:  Positive for fatigue. Negative for appetite change, chills and fever.  Respiratory:  Positive for apnea. Negative for cough, chest tightness and shortness of breath.   Cardiovascular:  Negative for chest pain and leg swelling.  Gastrointestinal:  Negative for abdominal pain, nausea and vomiting.  Psychiatric/Behavioral:  Positive for sleep disturbance.        Objective    BP 115/76 (BP Location: Left Arm, Patient Position: Sitting, Cuff Size: Large)   Pulse 73   Temp 97.6 F (36.4 C) (Temporal)   Resp 16   Ht 6' (1.829 m)   Wt (!) 313 lb 9.6 oz (142.2 kg)   SpO2 96%   BMI 42.53 kg/m  BP Readings from Last 3 Encounters:  12/04/22 115/76  12/01/22  124/76  10/01/22 120/77   Wt Readings from Last 3 Encounters:  12/04/22 (!) 313 lb 9.6 oz (142.2 kg)  12/01/22 (!) 311 lb 10.3 oz (141.4 kg)  10/01/22 (!) 320 lb (145.2 kg)      Physical Exam Vitals reviewed.  Constitutional:      General: He is not in acute distress.    Appearance: Normal appearance. He is not diaphoretic.  HENT:     Head: Normocephalic and atraumatic.  Eyes:     General: No scleral icterus.    Conjunctiva/sclera: Conjunctivae normal.  Cardiovascular:     Rate and  Rhythm: Normal rate and regular rhythm.     Heart sounds: Normal heart sounds. No murmur heard. Pulmonary:     Effort: Pulmonary effort is normal. No respiratory distress.     Breath sounds: Normal breath sounds. No wheezing or rhonchi.  Musculoskeletal:     Cervical back: Neck supple.     Right lower leg: No edema.     Left lower leg: No edema.  Lymphadenopathy:     Cervical: No cervical adenopathy.  Skin:    General: Skin is warm and dry.     Findings: No rash.  Neurological:     Mental Status: He is alert and oriented to person, place, and time. Mental status is at baseline.  Psychiatric:        Mood and Affect: Mood normal.        Behavior: Behavior normal.       No results found for any visits on 12/04/22.  Assessment & Plan     Problem List Items Addressed This Visit       Respiratory   Sleep apnea    Good compliance with CPAP and symptoms are well-controlled when using it His current CPAP seems to be dying and is making a loud noise and he feels that he needs a new CPAP machine New prescription sent for new CPAP machine to [facility] that he currently uses        Endocrine   T2DM (type 2 diabetes mellitus) ([place]) - Primary    Will plan to recheck A1c at next visit and determine need for additional medication He is hoping to see his A1c is improved and he does not need to add something like Ozempic        Return in about 4 weeks (around 01/01/2023) for chronic disease f/u.      I, Lavon Paganini, MD, have reviewed all documentation for this visit. The documentation on 12/04/22 for the exam, diagnosis, procedures, and orders are all accurate and complete.   Siyah Mault, Dionne Bucy, MD, MPH [facility]

## 2022-12-04 NOTE — Assessment & Plan Note (Signed)
Good compliance with CPAP and symptoms are well-controlled when using it His current CPAP seems to be dying and is making a loud noise and he feels that he needs a new CPAP machine New prescription sent for new CPAP machine to [facility] that he currently uses

## 2023-01-07 ENCOUNTER — Encounter: Payer: Self-pay | Admitting: Family Medicine

## 2023-01-07 ENCOUNTER — Ambulatory Visit (INDEPENDENT_AMBULATORY_CARE_PROVIDER_SITE_OTHER): Payer: 59 | Admitting: Family Medicine

## 2023-01-07 VITALS — BP 121/53 | HR 87 | Temp 97.8°F | Resp 24 | Wt 307.0 lb

## 2023-01-07 DIAGNOSIS — E1169 Type 2 diabetes mellitus with other specified complication: Secondary | ICD-10-CM

## 2023-01-07 LAB — POCT GLYCOSYLATED HEMOGLOBIN (HGB A1C)
Est. average glucose Bld gHb Est-mCnc: 177
Hemoglobin A1C: 7.8 % — AB (ref 4.0–5.6)

## 2023-01-07 MED ORDER — OZEMPIC (0.25 OR 0.5 MG/DOSE) 2 MG/3ML ~~LOC~~ SOPN: PEN_INJECTOR | SUBCUTANEOUS | 1 refills | Status: DC

## 2023-01-07 NOTE — Progress Notes (Signed)
I,Sulibeya S Dimas,acting as a Education administrator for Lavon Paganini, MD.,have documented all relevant documentation on the behalf of Lavon Paganini, MD,as directed by  Lavon Paganini, MD while in the presence of Lavon Paganini, MD.     Established patient visit   Patient: Dillon [facility].   DOB: [birth year]   57 y.o. Male  MRN: [id number] Visit Date: 01/07/2023  Today's healthcare provider: Lavon Paganini, MD   Chief Complaint  Patient presents with   Diabetes   Subjective    HPI  Diabetes Mellitus Type II, Follow-up  Lab Results  Component Value Date   HGBA1C 7.8 (A) 01/07/2023   HGBA1C 7.7 (H) 10/01/2022   HGBA1C 6.4 (A) 03/27/2022   Wt Readings from Last 3 Encounters:  01/07/23 (!) 307 lb (139.3 kg)  12/04/22 (!) 313 lb 9.6 oz (142.2 kg)  12/01/22 (!) 311 lb 10.3 oz (141.4 kg)   Last seen for diabetes 3 months ago.  Management since then includes no changes. He reports excellent compliance with treatment. He is not having side effects.   Home blood sugar records:  not being checked  Episodes of hypoglycemia? No    Current insulin regiment: none Most Recent Eye Exam: UTD  Pertinent Labs: Lab Results  Component Value Date   CHOL 134 10/01/2022   HDL 37 (L) 10/01/2022   LDLCALC 76 10/01/2022   TRIG 116 10/01/2022   CHOLHDL 3.3 03/27/2022   Lab Results  Component Value Date   NA 140 10/01/2022   K 4.3 10/01/2022   CREATININE 0.69 (L) 10/01/2022   EGFR 109 10/01/2022   LABMICR 10.3 03/27/2022   MICRALBCREAT 24 03/27/2022     ---------------------------------------------------------------------------------------------------   Medications: Outpatient Medications Prior to Visit  Medication Sig   atorvastatin (LIPITOR) 40 MG tablet TAKE 1 TABLET BY MOUTH EVERY DAY   JARDIANCE 25 MG TABS tablet TAKE 1 TABLET BY MOUTH EVERY DAY   losartan (COZAAR) 50 MG tablet TAKE 1 TABLET BY MOUTH EVERY DAY   Meloxicam 15 MG TBDP Take 15 mg by mouth daily.     metFORMIN (GLUCOPHAGE) 1000 MG tablet TAKE 1 TABLET BY MOUTH 2 TIMES DAILY WITH A MEAL. PLEASE SCHEDULE OFFICE VISIT BEFORE ANY REFILL.   No facility-administered medications prior to visit.    Review of Systems  Constitutional:  Negative for appetite change.  Eyes:  Negative for visual disturbance.  Respiratory:  Negative for cough and shortness of breath.   Cardiovascular:  Negative for chest pain and leg swelling.  Gastrointestinal:  Negative for abdominal pain, nausea and vomiting.  Neurological:  Negative for dizziness, light-headedness and headaches.       Objective    BP (!) 121/53 (BP Location: Right Arm, Patient Position: Sitting, Cuff Size: Large)   Pulse 87   Temp 97.8 F (36.6 C) (Temporal)   Resp (!) 24   Wt (!) 307 lb (139.3 kg)   SpO2 98%   BMI 41.64 kg/m    Physical Exam Vitals reviewed.  Constitutional:      General: He is not in acute distress.    Appearance: Normal appearance. He is not diaphoretic.  HENT:     Head: Normocephalic and atraumatic.  Eyes:     General: No scleral icterus.    Conjunctiva/sclera: Conjunctivae normal.  Cardiovascular:     Rate and Rhythm: Normal rate and regular rhythm.     Pulses: Normal pulses.     Heart sounds: Normal heart sounds. No murmur heard. Pulmonary:  Effort: Pulmonary effort is normal. No respiratory distress.     Breath sounds: Normal breath sounds. No wheezing or rhonchi.  Musculoskeletal:     Cervical back: Neck supple.     Right lower leg: No edema.     Left lower leg: No edema.  Lymphadenopathy:     Cervical: No cervical adenopathy.  Skin:    General: Skin is warm and dry.     Findings: No rash.  Neurological:     Mental Status: He is alert and oriented to person, place, and time. Mental status is at baseline.  Psychiatric:        Mood and Affect: Mood normal.        Behavior: Behavior normal.       Results for orders placed or performed in visit on 01/07/23  POCT glycosylated  hemoglobin (Hb A1C)  Result Value Ref Range   Hemoglobin A1C 7.8 (A) 4.0 - 5.6 %   Est. average glucose Bld gHb Est-mCnc 177     Assessment & Plan     Problem List Items Addressed This Visit       Endocrine   T2DM (type 2 diabetes mellitus) ([place]) - Primary    Chronic and uncontrolled A1c worsened slightly at 7.8 from 7.7 at last visit Patient had previously declined additional medication was working on lifestyle changes We did discuss diet and exercise Continue SGLT2 and metformin at current dose Add Ozempic-start with 0.25 mg weekly for 2 weeks and then increase to 0.5 mg weekly Discussed possible side effects Follow-up in 3 months, repeat A1c, and consider further dose titration      Relevant Medications   Semaglutide,0.25 or 0.5MG /DOS, (OZEMPIC, 0.25 OR 0.5 MG/DOSE,) 2 MG/3ML SOPN   Other Relevant Orders   POCT glycosylated hemoglobin (Hb A1C) (Completed)     Return in about 3 months (around 04/09/2023) for chronic disease f/u.      I, Lavon Paganini, MD, have reviewed all documentation for this visit. The documentation on 01/07/23 for the exam, diagnosis, procedures, and orders are all accurate and complete.   Jhonny Calixto, Dionne Bucy, MD, MPH [facility]

## 2023-01-07 NOTE — Assessment & Plan Note (Signed)
Chronic and uncontrolled A1c worsened slightly at 7.8 from 7.7 at last visit Patient had previously declined additional medication was working on lifestyle changes We did discuss diet and exercise Continue SGLT2 and metformin at current dose Add Ozempic-start with 0.25 mg weekly for 2 weeks and then increase to 0.5 mg weekly Discussed possible side effects Follow-up in 3 months, repeat A1c, and consider further dose titration

## 2023-02-01 ENCOUNTER — Other Ambulatory Visit: Payer: Self-pay | Admitting: Family Medicine

## 2023-03-12 ENCOUNTER — Other Ambulatory Visit: Payer: Self-pay | Admitting: Family Medicine

## 2023-04-20 ENCOUNTER — Ambulatory Visit: Payer: 59 | Admitting: Family Medicine

## 2023-04-27 ENCOUNTER — Other Ambulatory Visit: Payer: Self-pay | Admitting: Family Medicine

## 2023-05-18 ENCOUNTER — Other Ambulatory Visit: Payer: Self-pay | Admitting: Family Medicine

## 2023-05-19 ENCOUNTER — Other Ambulatory Visit: Payer: Self-pay | Admitting: Family Medicine

## 2023-05-26 LAB — HM DIABETES EYE EXAM

## 2023-06-10 ENCOUNTER — Other Ambulatory Visit: Payer: Self-pay | Admitting: Family Medicine

## 2023-07-13 ENCOUNTER — Encounter: Payer: Self-pay | Admitting: Family Medicine

## 2023-07-13 ENCOUNTER — Ambulatory Visit: Payer: 59 | Admitting: Family Medicine

## 2023-07-13 VITALS — BP 125/51 | HR 91 | Resp 16 | Ht 73.0 in | Wt 305.2 lb

## 2023-07-13 DIAGNOSIS — R519 Headache, unspecified: Secondary | ICD-10-CM | POA: Diagnosis not present

## 2023-07-13 DIAGNOSIS — Z23 Encounter for immunization: Secondary | ICD-10-CM | POA: Diagnosis not present

## 2023-07-13 DIAGNOSIS — E1169 Type 2 diabetes mellitus with other specified complication: Secondary | ICD-10-CM | POA: Diagnosis not present

## 2023-07-13 DIAGNOSIS — E1159 Type 2 diabetes mellitus with other circulatory complications: Secondary | ICD-10-CM

## 2023-07-13 DIAGNOSIS — I152 Hypertension secondary to endocrine disorders: Secondary | ICD-10-CM | POA: Diagnosis not present

## 2023-07-13 DIAGNOSIS — Z7985 Long-term (current) use of injectable non-insulin antidiabetic drugs: Secondary | ICD-10-CM

## 2023-07-13 LAB — POCT GLYCOSYLATED HEMOGLOBIN (HGB A1C)
Est. average glucose Bld gHb Est-mCnc: 140
Hemoglobin A1C: 6.5 % — AB (ref 4.0–5.6)

## 2023-07-13 MED ORDER — LOSARTAN POTASSIUM 25 MG PO TABS: 25.0000 mg | ORAL_TABLET | Freq: Every day | ORAL | 1 refills | Status: DC

## 2023-07-13 MED ORDER — METFORMIN HCL 1000 MG PO TABS: 1000.0000 mg | ORAL_TABLET | Freq: Two times a day (BID) | ORAL | 1 refills | Status: DC

## 2023-07-13 MED ORDER — OZEMPIC (0.25 OR 0.5 MG/DOSE) 2 MG/3ML ~~LOC~~ SOPN: 0.5000 mg | PEN_INJECTOR | SUBCUTANEOUS | 5 refills | Status: DC

## 2023-07-13 NOTE — Assessment & Plan Note (Addendum)
Occurring for the past 3 weeks  Low BP, Ozempic, working night shift, and caffeine tolerance may be contributing to headaches Reports good compliance with CPAP Encouraged adequate intake of food and water, encouraged good sleep hygiene  Decreasing losartan may improve headaches

## 2023-07-13 NOTE — Progress Notes (Signed)
Established Patient Office Visit  Subjective   Patient ID: Dillon Bolton., male    DOB: 05/25/1966  Age: 57 y.o. MRN: [id number]  Chief Complaint  Patient presents with   follow-up chronic disease    Dillon Bolton is here for medical management of chronic conditions. He is happy with his A1c which is down to 6.5. He is not having any side effects associated with the Ozempic.  He is concerned because of a 3 week history of headaches. He does not typically get headaches. They are localized on the R side of his head radiating down to his jaw. He denies sensitivity to light or noise, nausea, or vomiting, changes in sensation, vision or hearing changes. He denies runny nose or lactorrhea. He endorses tooth pain, his teeth on his R side feel more sensitive to temperature.    Patient Active Problem List   Diagnosis Date Noted   History of colonic polyps 12/01/2022   Adenomatous polyp of colon 12/01/2022   Chest pain 09/28/2019   Hypertension associated with diabetes (HCC) 06/16/2019   T2DM (type 2 diabetes mellitus) (HCC) 05/18/2018   Hyperlipidemia associated with type 2 diabetes mellitus (HCC) 05/17/2018   Morbid obesity (HCC) 11/09/2017   Tobacco use disorder 11/09/2017   Psoriasis 11/09/2017   Sleep apnea    Review of Systems  HENT:  Negative for congestion, hearing loss and tinnitus.   Eyes:  Negative for blurred vision, double vision, photophobia and pain.  Neurological:  Negative for dizziness (orthostatic), sensory change and headaches.     Objective:     BP (!) 125/51 (BP Location: Left Arm, Patient Position: Sitting, Cuff Size: Large)   Pulse 91   Resp 16   Ht 6\' 1"  (1.854 m)   Wt (!) 305 lb 3.2 oz (138.4 kg)   BMI 40.27 kg/m  BP Readings from Last 3 Encounters:  07/13/23 (!) 125/51  01/07/23 (!) 121/53  12/04/22 115/76   Wt Readings from Last 3 Encounters:  07/13/23 (!) 305 lb 3.2 oz (138.4 kg)  01/07/23 (!) 307 lb (139.3 kg)  12/04/22 (!) 313 lb 9.6 oz (142.2  kg)   Physical Exam Constitutional:      Appearance: Normal appearance.  Cardiovascular:     Rate and Rhythm: Normal rate and regular rhythm.     Heart sounds: Normal heart sounds.  Pulmonary:     Effort: Pulmonary effort is normal.  Neurological:     General: No focal deficit present.     Mental Status: He is alert and oriented to person, place, and time.    No results found for any visits on 07/13/23.  Last metabolic panel Lab Results  Component Value Date   GLUCOSE 144 (H) 10/01/2022   NA 140 10/01/2022   K 4.3 10/01/2022   CL 103 10/01/2022   CO2 21 10/01/2022   BUN 8 10/01/2022   CREATININE 0.69 (L) 10/01/2022   EGFR 109 10/01/2022   CALCIUM 9.6 10/01/2022   PROT 6.7 10/01/2022   ALBUMIN 4.1 10/01/2022   LABGLOB 2.6 10/01/2022   AGRATIO 1.6 10/01/2022   BILITOT 0.4 10/01/2022   ALKPHOS 90 10/01/2022   AST 18 10/01/2022   ALT 30 10/01/2022   ANIONGAP 11 07/18/2020   Last lipids Lab Results  Component Value Date   CHOL 134 10/01/2022   HDL 37 (L) 10/01/2022   LDLCALC 76 10/01/2022   TRIG 116 10/01/2022   CHOLHDL 3.3 03/27/2022   Last hemoglobin A1c Lab Results  Component Value  Date   HGBA1C 6.5 (A) 07/13/2023    The 10-year ASCVD risk score (Arnett DK, et al., 2019) is: 17.4%    Assessment & Plan:   Problem List Items Addressed This Visit       Cardiovascular and Mediastinum   Hypertension associated with diabetes (HCC)    Previously well controlled  BP in office today 125 / 51  Decrease losartan to 25 mg daily       Relevant Medications   losartan (COZAAR) 25 MG tablet   metFORMIN (GLUCOPHAGE) 1000 MG tablet   Semaglutide,0.25 or 0.5MG /DOS, (OZEMPIC, 0.25 OR 0.5 MG/DOSE,) 2 MG/3ML SOPN     Endocrine   T2DM (type 2 diabetes mellitus) (HCC) - Primary    Well controlled  A1c today was 6.5  Is currently taking Ozempic 0.25 mg for two weeks and then 0.5 mg for two weeks Increase to 0.5 mg Ozempic weekly  Urine microalbumin:cr  collected Fu in 3 mo        Relevant Medications   losartan (COZAAR) 25 MG tablet   metFORMIN (GLUCOPHAGE) 1000 MG tablet   Semaglutide,0.25 or 0.5MG /DOS, (OZEMPIC, 0.25 OR 0.5 MG/DOSE,) 2 MG/3ML SOPN   Other Relevant Orders   Urine microalbumin-creatinine with uACR   POCT glycosylated hemoglobin (Hb A1C) (Completed)     Other   New onset of headaches    Occurring for the past 3 weeks  Low BP, Ozempic, working night shift, and caffeine tolerance may be contributing to headaches Reports good compliance with CPAP Encouraged adequate intake of food and water, encouraged good sleep hygiene  Decreasing losartan may improve headaches        Other Visit Diagnoses     Need for influenza vaccination       Relevant Orders   Flu vaccine trivalent PF, 6mos and older(Flulaval,Afluria,Fluarix,Fluzone) (Completed)       Return in about 3 months (around 10/13/2023) for CPE.    Rometta Emery, Medical Student  Patient seen along with MS3 student Jodi Marble. I personally evaluated this patient along with the student, and verified all aspects of the history, physical exam, and medical decision making as documented by the student. I agree with the student's documentation and have made all necessary edits.  Mehtab Dolberry, Marzella Schlein, MD, MPH [facility]

## 2023-07-13 NOTE — Assessment & Plan Note (Signed)
Previously well controlled  BP in office today 125 / 51  Decrease losartan to 25 mg daily

## 2023-07-13 NOTE — Assessment & Plan Note (Addendum)
Well controlled  A1c today was 6.5  Is currently taking Ozempic 0.25 mg for two weeks and then 0.5 mg for two weeks Increase to 0.5 mg Ozempic weekly  Urine microalbumin:cr collected Fu in 3 mo

## 2023-07-14 LAB — MICROALBUMIN / CREATININE URINE RATIO
Creatinine, Urine: 55 mg/dL
Microalb/Creat Ratio: 17 mg/g{creat} (ref 0–29)
Microalbumin, Urine: 9.4 ug/mL

## 2023-07-19 ENCOUNTER — Other Ambulatory Visit: Payer: Self-pay | Admitting: Family Medicine

## 2023-07-19 NOTE — Telephone Encounter (Signed)
Requested by interface surescripts. Future visit in 3 months. Last labs 10/01/22.  Requested Prescriptions  Pending Prescriptions Disp Refills   atorvastatin (LIPITOR) 40 MG tablet [Pharmacy Med Name: ATORVASTATIN 40 MG TABLET] 30 tablet 2    Sig: TAKE 1 TABLET BY MOUTH EVERY DAY     Cardiovascular:  Antilipid - Statins Failed - 07/19/2023  1:17 AM      Failed - Lipid Panel in normal range within the last 12 months    Cholesterol, Total  Date Value Ref Range Status  10/01/2022 134 100 - 199 mg/dL Final   LDL Chol Calc (NIH)  Date Value Ref Range Status  10/01/2022 76 0 - 99 mg/dL Final   HDL  Date Value Ref Range Status  10/01/2022 37 (L) >39 mg/dL Final   Triglycerides  Date Value Ref Range Status  10/01/2022 116 0 - 149 mg/dL Final         Passed - Patient is not pregnant      Passed - Valid encounter within last 12 months    Recent Outpatient Visits           6 days ago Type 2 diabetes mellitus with other specified complication, without long-term current use of insulin (HCC)   [place] [place], Marzella Schlein, MD   6 months ago Type 2 diabetes mellitus with other specified complication, without long-term current use of insulin [facility])   [place] [facility] [place], Marzella Schlein, MD   7 months ago Type 2 diabetes mellitus with other specified complication, without long-term current use of insulin [facility])   [place] [facility] [place], Marzella Schlein, MD   9 months ago Encounter for annual physical exam   [place] [facility] [place], Marzella Schlein, MD   12 months ago Non-recurrent acute suppurative otitis media of left ear without spontaneous rupture of tympanic membrane   [facility] Jacky Kindle, FNP       Future Appointments             In 3 months [place], Marzella Schlein, MD [facility]

## 2023-08-17 ENCOUNTER — Other Ambulatory Visit: Payer: Self-pay | Admitting: Family Medicine

## 2023-08-18 NOTE — Telephone Encounter (Signed)
Requested medication (s) are due for refill today:   Yes  Requested medication (s) are on the active medication list:   Yes  Future visit scheduled:   Yes   Last ordered: 05/19/2023 #30, 2 refills  Unable to refill because a cr. Is due per protocol   Requested Prescriptions  Pending Prescriptions Disp Refills   JARDIANCE 25 MG TABS tablet [Pharmacy Med Name: JARDIANCE 25 MG TABLET] 30 tablet 2    Sig: TAKE 1 TABLET BY MOUTH EVERY DAY     Endocrinology:  Diabetes - SGLT2 Inhibitors Failed - 08/17/2023  1:30 AM      Failed - Cr in normal range and within 360 days    Creatinine, Ser  Date Value Ref Range Status  10/01/2022 0.69 (L) 0.76 - 1.27 mg/dL Final         Passed - HBA1C is between 0 and 7.9 and within 180 days    Hemoglobin A1C  Date Value Ref Range Status  07/13/2023 6.5 (A) 4.0 - 5.6 % Final   Hgb A1c MFr Bld  Date Value Ref Range Status  10/01/2022 7.7 (H) 4.8 - 5.6 % Final    Comment:             Prediabetes: 5.7 - 6.4          Diabetes: >6.4          Glycemic control for adults with diabetes: <7.0          Passed - eGFR in normal range and within 360 days    GFR calc Af Amer  Date Value Ref Range Status  09/28/2019 124 >59 mL/min/1.73 Final   GFR calc non Af Amer  Date Value Ref Range Status  07/18/2020 >60 >60 mL/min Final   eGFR  Date Value Ref Range Status  10/01/2022 109 >59 mL/min/1.73 Final         Passed - Valid encounter within last 6 months    Recent Outpatient Visits           1 month ago Type 2 diabetes mellitus with other specified complication, without long-term current use of insulin (HCC)   [place] [facility] [place], Marzella Schlein, MD   7 months ago Type 2 diabetes mellitus with other specified complication, without long-term current use of insulin [facility])   [facility] [place], Marzella Schlein, MD   8 months ago Type 2 diabetes mellitus with other specified complication, without long-term  current use of insulin [facility])   [place] [facility] [place], Marzella Schlein, MD   10 months ago Encounter for annual physical exam    [facility] [place], Marzella Schlein, MD   1 year ago Non-recurrent acute suppurative otitis media of left ear without spontaneous rupture of tympanic membrane   [facility] Jacky Kindle, FNP       Future Appointments             In 2 months Bacigalupo, Marzella Schlein, MD [facility]

## 2023-10-16 ENCOUNTER — Other Ambulatory Visit: Payer: Self-pay | Admitting: Family Medicine

## 2023-10-19 NOTE — Telephone Encounter (Signed)
 Requested medication (s) are due for refill today: Yes  Requested medication (s) are on the active medication list: Yes  Last refill:  07/19/23  Future visit scheduled: Yes  Notes to clinic:  Unable to refill per protocol due to failed labs, no updated results.      Requested Prescriptions  Pending Prescriptions Disp Refills   atorvastatin  (LIPITOR) 40 MG tablet [Pharmacy Med Name: ATORVASTATIN  40 MG TABLET] 30 tablet 2    Sig: TAKE 1 TABLET BY MOUTH EVERY DAY     Cardiovascular:  Antilipid - Statins Failed - 10/19/2023  6:13 PM      Failed - Lipid Panel in normal range within the last 12 months    Cholesterol, Total  Date Value Ref Range Status  10/01/2022 134 100 - 199 mg/dL Final   LDL Chol Calc (NIH)  Date Value Ref Range Status  10/01/2022 76 0 - 99 mg/dL Final   HDL  Date Value Ref Range Status  10/01/2022 37 (L) >39 mg/dL Final   Triglycerides  Date Value Ref Range Status  10/01/2022 116 0 - 149 mg/dL Final         Passed - Patient is not pregnant      Passed - Valid encounter within last 12 months    Recent Outpatient Visits           3 months ago Type 2 diabetes mellitus with other specified complication, without long-term current use of insulin (HCC)   [place] Bolton, Dillon HERO, Dillon Bolton   9 months ago Type 2 diabetes mellitus with other specified complication, without long-term current use of insulin [facility])   [place] [facility] [place], Dillon HERO, Dillon Bolton   10 months ago Type 2 diabetes mellitus with other specified complication, without long-term current use of insulin [facility])   [place] [facility] [place], Dillon HERO, Dillon Bolton   1 year ago Encounter for annual physical exam    [facility] HERO, Dillon Bolton   1 year ago Non-recurrent acute suppurative otitis media of left ear without spontaneous rupture of tympanic membrane   [facility]  Emilio Kelly DASEN, FNP       Future Appointments             In 1 week Bacigalupo, Dillon HERO, Dillon Bolton [facility]

## 2023-10-28 ENCOUNTER — Encounter: Payer: Self-pay | Admitting: Family Medicine

## 2023-10-28 ENCOUNTER — Ambulatory Visit: Payer: 59 | Admitting: Family Medicine

## 2023-10-28 VITALS — BP 122/64 | HR 88 | Ht 72.0 in | Wt 306.3 lb

## 2023-10-28 DIAGNOSIS — Z23 Encounter for immunization: Secondary | ICD-10-CM | POA: Diagnosis not present

## 2023-10-28 DIAGNOSIS — Z0001 Encounter for general adult medical examination with abnormal findings: Secondary | ICD-10-CM | POA: Diagnosis not present

## 2023-10-28 DIAGNOSIS — E1159 Type 2 diabetes mellitus with other circulatory complications: Secondary | ICD-10-CM | POA: Diagnosis not present

## 2023-10-28 DIAGNOSIS — Z0000 Encounter for general adult medical examination without abnormal findings: Secondary | ICD-10-CM

## 2023-10-28 DIAGNOSIS — F17200 Nicotine dependence, unspecified, uncomplicated: Secondary | ICD-10-CM

## 2023-10-28 DIAGNOSIS — E1169 Type 2 diabetes mellitus with other specified complication: Secondary | ICD-10-CM | POA: Diagnosis not present

## 2023-10-28 DIAGNOSIS — Z7985 Long-term (current) use of injectable non-insulin antidiabetic drugs: Secondary | ICD-10-CM

## 2023-10-28 DIAGNOSIS — E785 Hyperlipidemia, unspecified: Secondary | ICD-10-CM

## 2023-10-28 DIAGNOSIS — I152 Hypertension secondary to endocrine disorders: Secondary | ICD-10-CM

## 2023-10-28 MED ORDER — SEMAGLUTIDE (1 MG/DOSE) 4 MG/3ML ~~LOC~~ SOPN: 1.0000 mg | PEN_INJECTOR | SUBCUTANEOUS | 1 refills | Status: DC

## 2023-10-28 NOTE — Assessment & Plan Note (Signed)
Weight fluctuates between 285 and 295 lbs. Plateaued in weight loss despite current Ozempic dose. Discussed increasing Ozempic to 1 mg weekly to aid in further weight loss. Explained that weight loss response to Ozempic can be person-dependent. - Increase Ozempic to 1 mg weekly.

## 2023-10-28 NOTE — Assessment & Plan Note (Signed)
Well controlled with current medication regimen. -Continue current medications.

## 2023-10-28 NOTE — Assessment & Plan Note (Signed)
Continues to smoke. Expressed concern due to a family member's recent lung cancer diagnosis. Discussed readiness to quit and available resources for smoking cessation. - Offer resources and support for smoking cessation when ready.

## 2023-10-28 NOTE — Assessment & Plan Note (Signed)
Well-controlled with current medications. Last A1c was 6.5. Plateaued in weight loss despite current Ozempic dose. Discussed increasing Ozempic to 1 mg weekly to achieve further weight loss. Explained that some patients do not experience weight loss until higher doses are reached. Patient agreed to increase the dose. - Increase Ozempic to 1 mg weekly. - Order A1c, cholesterol, kidney, and liver function tests.

## 2023-10-28 NOTE — Progress Notes (Signed)
Complete physical exam   Patient: Dillon Bolton.   DOB: [birth year]   58 y.o. Male  MRN: [id number] Visit Date: 10/28/2023  Today's healthcare provider: Shirlee Latch, MD   Chief Complaint  Patient presents with   Annual Exam    Last completed 10/01/22 Diet - General, unhealthy Exercise - Strenuous Job  Feeling - Well Sleeping - Poorly due to working night shift Concerns - none   Subjective    Dillon Bolton. is a 58 y.o. male who presents today for a complete physical exam.   Discussed the use of AI scribe software for clinical note transcription with the patient, who gave verbal consent to proceed.  History of Present Illness   Ayan, a 58 year old male with a history of hypertension, type two diabetes, hyperlipidemia, morbid obesity, and tobacco use disorder, presents for a routine physical. The patient reports a recent nosebleed that occurred while eating spicy [place] food at work. The nosebleed stopped on its own and has not recurred since the initial episode. The patient uses a CPAP machine with a water tank at home, suggesting that the air in his sleeping environment is humidified. The patient works in a temperature and humidity-controlled environment, but was outside eating when the nosebleed occurred.  The patient also expresses concern about his weight, which has plateaued despite a loss of almost 70 pounds. The patient is currently taking Ozempic 0.5 mg weekly, which has affected his appetite but has not resulted in further weight loss. The patient expresses a desire for more weight loss benefit from the medication.  The patient continues to smoke, despite a family member's recent diagnosis of lung cancer. The patient acknowledges the risks associated with smoking but does not express readiness to quit at this time.        Last depression screening scores    07/13/2023    3:27 PM 01/07/2023   11:05 AM 12/04/2022   10:51 AM  PHQ 2/9 Scores  PHQ -  2 Score 0 0 0  PHQ- 9 Score 3 2 2    Last fall risk screening    07/13/2023    3:27 PM  Fall Risk   Falls in the past year? 1  Number falls in past yr: 0  Injury with Fall? 0  Risk for fall due to : No Fall Risks        Medications: Outpatient Medications Prior to Visit  Medication Sig   atorvastatin (LIPITOR) 40 MG tablet TAKE 1 TABLET BY MOUTH EVERY DAY   JARDIANCE 25 MG TABS tablet TAKE 1 TABLET BY MOUTH EVERY DAY   losartan (COZAAR) 25 MG tablet Take 1 tablet (25 mg total) by mouth daily.   Meloxicam 15 MG TBDP Take 15 mg by mouth daily.    metFORMIN (GLUCOPHAGE) 1000 MG tablet Take 1 tablet (1,000 mg total) by mouth 2 (two) times daily with a meal.   [DISCONTINUED] Semaglutide,0.25 or 0.5MG /DOS, (OZEMPIC, 0.25 OR 0.5 MG/DOSE,) 2 MG/3ML SOPN Inject 0.5 mg into the skin once a week.   No facility-administered medications prior to visit.    Review of Systems    Objective    BP 122/64 (BP Location: Left Arm, Patient Position: Sitting, Cuff Size: Large)   Pulse 88   Ht 6' (1.829 m)   Wt (!) 306 lb 4.8 oz (138.9 kg)   SpO2 100%   BMI 41.54 kg/m    Physical Exam Vitals reviewed.  Constitutional:      General:  He is not in acute distress.    Appearance: Normal appearance. He is well-developed. He is not diaphoretic.  HENT:     Head: Normocephalic and atraumatic.     Right Ear: Tympanic membrane, ear canal and external ear normal.     Left Ear: Tympanic membrane, ear canal and external ear normal.     Nose: Nose normal.     Mouth/Throat:     Mouth: Mucous membranes are moist.     Pharynx: Oropharynx is clear. No oropharyngeal exudate.  Eyes:     General: No scleral icterus.    Conjunctiva/sclera: Conjunctivae normal.     Pupils: Pupils are equal, round, and reactive to light.  Neck:     Thyroid: No thyromegaly.  Cardiovascular:     Rate and Rhythm: Normal rate and regular rhythm.     Heart sounds: Normal heart sounds. No murmur heard. Pulmonary:      Effort: Pulmonary effort is normal. No respiratory distress.     Breath sounds: Normal breath sounds. No wheezing or rales.  Abdominal:     General: There is no distension.     Palpations: Abdomen is soft.     Tenderness: There is no abdominal tenderness.  Musculoskeletal:        General: No deformity.     Cervical back: Neck supple.     Right lower leg: No edema.     Left lower leg: No edema.  Lymphadenopathy:     Cervical: No cervical adenopathy.  Skin:    General: Skin is warm and dry.     Findings: No rash.  Neurological:     Mental Status: He is alert and oriented to person, place, and time. Mental status is at baseline.     Gait: Gait normal.  Psychiatric:        Mood and Affect: Mood normal.        Behavior: Behavior normal.        Thought Content: Thought content normal.      No results found for any visits on 10/28/23.  Assessment & Plan    Routine Health Maintenance and Physical Exam  Exercise Activities and Dietary recommendations  Goals   None     Immunization History  Administered Date(s) Administered   Influenza Inj Mdck Quad Pf 08/04/2018   Influenza Split 08/04/2015   Influenza, Seasonal, Injecte, Preservative Fre 07/13/2023   Influenza,inj,Quad PF,6+ Mos 11/09/2017, 06/16/2019, 08/25/2020, 08/04/2021, 07/17/2022   Moderna Sars-Covid-2 Vaccination 02/19/2020, 03/18/2020   PNEUMOCOCCAL CONJUGATE-20 10/28/2023   Pneumococcal Polysaccharide-23 05/17/2018   Tdap 11/09/2017   Zoster Recombinant(Shingrix) 09/28/2019, 01/02/2021    Health Maintenance  Topic Date Due   HIV Screening  Never done   COVID-19 Vaccine (3 - 2024-25 season) 06/13/2023   Diabetic kidney evaluation - eGFR measurement  10/02/2023   HEMOGLOBIN A1C  01/11/2024   OPHTHALMOLOGY EXAM  05/25/2024   Diabetic kidney evaluation - Urine ACR  07/12/2024   FOOT EXAM  10/27/2024   Colonoscopy  12/01/2025   DTaP/Tdap/Td (2 - Td or Tdap) 11/10/2027   Pneumococcal Vaccine 32-40 Years old   Completed   INFLUENZA VACCINE  Completed   Hepatitis C Screening  Completed   Zoster Vaccines- Shingrix  Completed   HPV VACCINES  Aged Out    Discussed health benefits of physical activity, and encouraged him to engage in regular exercise appropriate for his age and condition.  Problem List Items Addressed This Visit       Cardiovascular and Mediastinum  Hypertension associated with diabetes (HCC)   Well-controlled with current medication regimen. - Continue current medications.      Relevant Medications   Semaglutide, 1 MG/DOSE, 4 MG/3ML SOPN   Other Relevant Orders   Comprehensive metabolic panel   Lipid panel     Endocrine   Hyperlipidemia associated with type 2 diabetes mellitus (HCC)   Managed with Lipitor. No recent lipid panel results discussed. - Order lipid panel.      Relevant Medications   Semaglutide, 1 MG/DOSE, 4 MG/3ML SOPN   Other Relevant Orders   Comprehensive metabolic panel   Lipid panel   T2DM (type 2 diabetes mellitus) (HCC)   Well-controlled with current medications. Last A1c was 6.5. Plateaued in weight loss despite current Ozempic dose. Discussed increasing Ozempic to 1 mg weekly to achieve further weight loss. Explained that some patients do not experience weight loss until higher doses are reached. Patient agreed to increase the dose. - Increase Ozempic to 1 mg weekly. - Order A1c, cholesterol, kidney, and liver function tests.      Relevant Medications   Semaglutide, 1 MG/DOSE, 4 MG/3ML SOPN   Other Relevant Orders   Hemoglobin A1c   Lipid panel     Other   Morbid obesity (HCC)   Weight fluctuates between 285 and 295 lbs. Plateaued in weight loss despite current Ozempic dose. Discussed increasing Ozempic to 1 mg weekly to aid in further weight loss. Explained that weight loss response to Ozempic can be person-dependent. - Increase Ozempic to 1 mg weekly.      Relevant Medications   Semaglutide, 1 MG/DOSE, 4 MG/3ML SOPN   Tobacco use  disorder   Continues to smoke. Expressed concern due to a family member's recent lung cancer diagnosis. Discussed readiness to quit and available resources for smoking cessation. - Offer resources and support for smoking cessation when ready.      Other Visit Diagnoses       Encounter for annual physical exam    -  Primary   Relevant Orders   Hemoglobin A1c   Comprehensive metabolic panel   Lipid panel     Immunization due       Relevant Orders   Pneumococcal conjugate vaccine 20-valent (Completed)           Epistaxis Intermittent epistaxis likely exacerbated by dry air and spicy food. No recurrence since initial episode. Discussed humidifier use. - continue CPAP with humidifier  General Health Maintenance Due for updated pneumonia vaccination. Routine foot exam and lab work are also due. - Administer pneumonia vaccination. - Perform foot exam. - Order routine lab work including A1c, cholesterol, kidney, and liver function tests.  Follow-up - Schedule follow-up appointment in six months.        Return in about 6 months (around 04/26/2024) for chronic disease f/u.     Shirlee Latch, MD  [facility] [phone] (phone) [phone] (fax)  [facility]

## 2023-10-28 NOTE — Assessment & Plan Note (Signed)
Managed with Lipitor. No recent lipid panel results discussed. - Order lipid panel.

## 2023-10-29 LAB — COMPREHENSIVE METABOLIC PANEL
ALT: 33 [IU]/L (ref 0–44)
AST: 18 [IU]/L (ref 0–40)
Albumin: 4.1 g/dL (ref 3.8–4.9)
Alkaline Phosphatase: 89 [IU]/L (ref 44–121)
BUN/Creatinine Ratio: 17 (ref 9–20)
BUN: 11 mg/dL (ref 6–24)
Bilirubin Total: 0.3 mg/dL (ref 0.0–1.2)
CO2: 21 mmol/L (ref 20–29)
Calcium: 9.4 mg/dL (ref 8.7–10.2)
Chloride: 108 mmol/L — ABNORMAL HIGH (ref 96–106)
Creatinine, Ser: 0.63 mg/dL — ABNORMAL LOW (ref 0.76–1.27)
Globulin, Total: 2.5 g/dL (ref 1.5–4.5)
Glucose: 121 mg/dL — ABNORMAL HIGH (ref 70–99)
Potassium: 4.1 mmol/L (ref 3.5–5.2)
Sodium: 142 mmol/L (ref 134–144)
Total Protein: 6.6 g/dL (ref 6.0–8.5)
eGFR: 111 mL/min/{1.73_m2} (ref >59)

## 2023-10-29 LAB — HEMOGLOBIN A1C
Est. average glucose Bld gHb Est-mCnc: 146 mg/dL
Hgb A1c MFr Bld: 6.7 % — ABNORMAL HIGH (ref 4.8–5.6)

## 2023-10-29 LAB — LIPID PANEL
Chol/HDL Ratio: 3.5 {ratio} (ref 0.0–5.0)
Cholesterol, Total: 117 mg/dL (ref 100–199)
HDL: 33 mg/dL — ABNORMAL LOW (ref >39)
LDL Chol Calc (NIH): 66 mg/dL (ref 0–99)
Triglycerides: 96 mg/dL (ref 0–149)
VLDL Cholesterol Cal: 18 mg/dL (ref 5–40)

## 2023-11-14 ENCOUNTER — Other Ambulatory Visit: Payer: Self-pay | Admitting: Family Medicine

## 2023-11-16 NOTE — Telephone Encounter (Signed)
 Requested Prescriptions  Pending Prescriptions Disp Refills   empagliflozin  (JARDIANCE ) 25 MG TABS tablet [Pharmacy Med Name: JARDIANCE  25 MG TABLET] 90 tablet 1    Sig: TAKE 1 TABLET BY MOUTH EVERY DAY     Endocrinology:  Diabetes - SGLT2 Inhibitors Failed - 11/16/2023  8:41 AM      Failed - Cr in normal range and within 360 days    Creatinine, Ser  Date Value Ref Range Status  10/28/2023 0.63 (L) 0.76 - 1.27 mg/dL Final         Passed - HBA1C is between 0 and 7.9 and within 180 days    Hgb A1c MFr Bld  Date Value Ref Range Status  10/28/2023 6.7 (H) 4.8 - 5.6 % Final    Comment:             Prediabetes: 5.7 - 6.4          Diabetes: >6.4          Glycemic control for adults with diabetes: <7.0          Passed - eGFR in normal range and within 360 days    GFR calc Af Amer  Date Value Ref Range Status  09/28/2019 124 >59 mL/min/1.73 Final   GFR calc non Af Amer  Date Value Ref Range Status  07/18/2020 >60 >60 mL/min Final   eGFR  Date Value Ref Range Status  10/28/2023 111 >59 mL/min/1.73 Final         Passed - Valid encounter within last 6 months    Recent Outpatient Visits           2 weeks ago Encounter for annual physical exam   [place] [facility] [place], Jon HERO, MD   4 months ago Type 2 diabetes mellitus with other specified complication, without long-term current use of insulin [facility])   [place] [facility] [place], Jon HERO, MD   10 months ago Type 2 diabetes mellitus with other specified complication, without long-term current use of insulin [facility])   [place] [facility] [place], Jon HERO, MD   11 months ago Type 2 diabetes mellitus with other specified complication, without long-term current use of insulin [facility])    [facility] [place], Jon HERO, MD   1 year ago Encounter for annual physical exam   [place] [facility] [place], Jon HERO, MD

## 2023-12-15 ENCOUNTER — Other Ambulatory Visit: Payer: Self-pay | Admitting: Family Medicine

## 2023-12-15 NOTE — Telephone Encounter (Signed)
 Requested Prescriptions  Pending Prescriptions Disp Refills   losartan (COZAAR) 25 MG tablet [Pharmacy Med Name: LOSARTAN POTASSIUM 25 MG TAB] 30 tablet 4    Sig: TAKE 1 TABLET (25 MG TOTAL) BY MOUTH DAILY.     Cardiovascular:  Angiotensin Receptor Blockers Failed - 12/15/2023  2:57 PM      Failed - Cr in normal range and within 180 days    Creatinine, Ser  Date Value Ref Range Status  10/28/2023 0.63 (L) 0.76 - 1.27 mg/dL Final         Passed - K in normal range and within 180 days    Potassium  Date Value Ref Range Status  10/28/2023 4.1 3.5 - 5.2 mmol/L Final         Passed - Patient is not pregnant      Passed - Last BP in normal range    BP Readings from Last 1 Encounters:  10/28/23 122/64         Passed - Valid encounter within last 6 months    Recent Outpatient Visits           1 month ago Encounter for annual physical exam   [place] [facility] [place], Marzella Schlein, MD   5 months ago Type 2 diabetes mellitus with other specified complication, without long-term current use of insulin [facility])   [place] [facility] [place], Marzella Schlein, MD   11 months ago Type 2 diabetes mellitus with other specified complication, without long-term current use of insulin [facility])   [place] [facility] [place], Marzella Schlein, MD   1 year ago Type 2 diabetes mellitus with other specified complication, without long-term current use of insulin [facility])    [facility] [place], Marzella Schlein, MD   1 year ago Encounter for annual physical exam   [facility] [place], Marzella Schlein, MD

## 2023-12-18 ENCOUNTER — Other Ambulatory Visit: Payer: Self-pay | Admitting: Family Medicine

## 2023-12-20 NOTE — Telephone Encounter (Signed)
 Requested Prescriptions  Pending Prescriptions Disp Refills   Semaglutide, 1 MG/DOSE, (OZEMPIC, 1 MG/DOSE,) 4 MG/3ML SOPN [Pharmacy Med Name: OZEMPIC 4 MG/3 ML (1 MG/DOSE)] 9 mL 0    Sig: INJECT 1MG  INTO THE SKIN ONCE A WEEK     Endocrinology:  Diabetes - GLP-1 Receptor Agonists - semaglutide Failed - 12/20/2023  1:48 PM      Failed - HBA1C in normal range and within 180 days    Hgb A1c MFr Bld  Date Value Ref Range Status  10/28/2023 6.7 (H) 4.8 - 5.6 % Final    Comment:             Prediabetes: 5.7 - 6.4          Diabetes: >6.4          Glycemic control for adults with diabetes: <7.0          Failed - Cr in normal range and within 360 days    Creatinine, Ser  Date Value Ref Range Status  10/28/2023 0.63 (L) 0.76 - 1.27 mg/dL Final         Passed - Valid encounter within last 6 months    Recent Outpatient Visits           1 month ago Encounter for annual physical exam   [place] [facility] [place], Marzella Schlein, MD   5 months ago Type 2 diabetes mellitus with other specified complication, without long-term current use of insulin [facility])   [place] [facility] [place], Marzella Schlein, MD   11 months ago Type 2 diabetes mellitus with other specified complication, without long-term current use of insulin [facility])   [place] [facility] [place], Marzella Schlein, MD   1 year ago Type 2 diabetes mellitus with other specified complication, without long-term current use of insulin [facility])   [place] [facility] [place], Marzella Schlein, MD   1 year ago Encounter for annual physical exam   [facility] [place], Marzella Schlein, MD

## 2023-12-29 ENCOUNTER — Other Ambulatory Visit: Payer: Self-pay | Admitting: Family Medicine

## 2023-12-30 NOTE — Telephone Encounter (Signed)
 Requested Prescriptions  Pending Prescriptions Disp Refills   metFORMIN (GLUCOPHAGE) 1000 MG tablet [Pharmacy Med Name: METFORMIN HCL 1,000 MG TABLET] 60 tablet 5    Sig: TAKE 1 TABLET (1,000 MG TOTAL) BY MOUTH TWICE A DAY WITH FOOD     Endocrinology:  Diabetes - Biguanides Failed - 12/30/2023  9:41 AM      Failed - Cr in normal range and within 360 days    Creatinine, Ser  Date Value Ref Range Status  10/28/2023 0.63 (L) 0.76 - 1.27 mg/dL Final         Failed - B12 Level in normal range and within 720 days    No results found for: "VITAMINB12"       Failed - CBC within normal limits and completed in the last 12 months    WBC  Date Value Ref Range Status  01/02/2021 9.1 3.4 - 10.8 x10E3/uL Final  07/18/2020 17.7 (H) 4.0 - 10.5 K/uL Final   RBC  Date Value Ref Range Status  01/02/2021 5.02 4.14 - 5.80 x10E6/uL Final  07/18/2020 5.11 4.22 - 5.81 MIL/uL Final   Hemoglobin  Date Value Ref Range Status  01/02/2021 15.6 13.0 - 17.7 g/dL Final   Hematocrit  Date Value Ref Range Status  01/02/2021 45.8 37.5 - 51.0 % Final   MCHC  Date Value Ref Range Status  01/02/2021 34.1 31.5 - 35.7 g/dL Final  16/07/9603 54.0 30.0 - 36.0 g/dL Final   [facility]  Date Value Ref Range Status  01/02/2021 31.1 26.6 - 33.0 pg Final  07/18/2020 30.7 26.0 - 34.0 pg Final   MCV  Date Value Ref Range Status  01/02/2021 91 79 - 97 fL Final   No results found for: "PLTCOUNTKUC", "LABPLAT", "POCPLA" RDW  Date Value Ref Range Status  01/02/2021 13.1 11.6 - 15.4 % Final         Passed - HBA1C is between 0 and 7.9 and within 180 days    Hgb A1c MFr Bld  Date Value Ref Range Status  10/28/2023 6.7 (H) 4.8 - 5.6 % Final    Comment:             Prediabetes: 5.7 - 6.4          Diabetes: >6.4          Glycemic control for adults with diabetes: <7.0          Passed - eGFR in normal range and within 360 days    GFR calc Af Amer  Date Value Ref Range Status  09/28/2019 124 >59 mL/min/1.73 Final    GFR calc non Af Amer  Date Value Ref Range Status  07/18/2020 >60 >60 mL/min Final   eGFR  Date Value Ref Range Status  10/28/2023 111 >59 mL/min/1.73 Final         Passed - Valid encounter within last 6 months    Recent Outpatient Visits           2 months ago Encounter for annual physical exam   [facility] [place], Marzella Schlein, MD   5 months ago Type 2 diabetes mellitus with other specified complication, without long-term current use of insulin [facility])   [facility] [place], Marzella Schlein, MD   11 months ago Type 2 diabetes mellitus with other specified complication, without long-term current use of insulin [facility])   [place] [place], Marzella Schlein, MD   1 year ago Type 2 diabetes mellitus with  other specified complication, without long-term current use of insulin [facility])   [facility] [place], Marzella Schlein, MD   1 year ago Encounter for annual physical exam   [facility] [place], Marzella Schlein, MD

## 2024-01-14 ENCOUNTER — Other Ambulatory Visit: Payer: Self-pay | Admitting: Family Medicine

## 2024-01-14 NOTE — Telephone Encounter (Signed)
 Last OV 10/28/23 Requested Prescriptions  Pending Prescriptions Disp Refills   atorvastatin (LIPITOR) 40 MG tablet [Pharmacy Med Name: ATORVASTATIN 40 MG TABLET] 30 tablet 2    Sig: TAKE 1 TABLET BY MOUTH EVERY DAY     Cardiovascular:  Antilipid - Statins Failed - 01/14/2024  2:56 PM      Failed - Valid encounter within last 12 months    Recent Outpatient Visits   None            Failed - Lipid Panel in normal range within the last 12 months    Cholesterol, Total  Date Value Ref Range Status  10/28/2023 117 100 - 199 mg/dL Final   LDL Chol Calc (NIH)  Date Value Ref Range Status  10/28/2023 66 0 - 99 mg/dL Final   HDL  Date Value Ref Range Status  10/28/2023 33 (L) >39 mg/dL Final   Triglycerides  Date Value Ref Range Status  10/28/2023 96 0 - 149 mg/dL Final         Passed - Patient is not pregnant

## 2024-03-08 ENCOUNTER — Other Ambulatory Visit: Payer: Self-pay | Admitting: Family Medicine

## 2024-03-28 ENCOUNTER — Other Ambulatory Visit: Payer: Self-pay | Admitting: Family Medicine

## 2024-04-22 ENCOUNTER — Other Ambulatory Visit: Payer: Self-pay | Admitting: Family Medicine

## 2024-04-24 NOTE — Telephone Encounter (Signed)
 Requested medications are due for refill today.  yes  Requested medications are on the active medications list.  yes  Last refill. 03/28/2024 #60 0 rf  Future visit scheduled.   no  Notes to clinic.  Pt already given a courtesy refill. Labs are expired.    Requested Prescriptions  Pending Prescriptions Disp Refills   metFORMIN  (GLUCOPHAGE ) 1000 MG tablet [Pharmacy Med Name: METFORMIN  HCL 1,000 MG TABLET] 60 tablet 0    Sig: TAKE 1 TABLET (1,000 MG TOTAL) BY MOUTH TWICE A DAY WITH FOOD     Endocrinology:  Diabetes - Biguanides Failed - 04/24/2024  4:25 PM      Failed - Cr in normal range and within 360 days    Creatinine, Ser  Date Value Ref Range Status  10/28/2023 0.63 (L) 0.76 - 1.27 mg/dL Final         Failed - B12 Level in normal range and within 720 days    No results found for: VITAMINB12       Failed - Valid encounter within last 6 months    Recent Outpatient Visits   None            Failed - CBC within normal limits and completed in the last 12 months    WBC  Date Value Ref Range Status  01/02/2021 9.1 3.4 - 10.8 x10E3/uL Final  07/18/2020 17.7 (H) 4.0 - 10.5 K/uL Final   RBC  Date Value Ref Range Status  01/02/2021 5.02 4.14 - 5.80 x10E6/uL Final  07/18/2020 5.11 4.22 - 5.81 MIL/uL Final   Hemoglobin  Date Value Ref Range Status  01/02/2021 15.6 13.0 - 17.7 g/dL Final   Hematocrit  Date Value Ref Range Status  01/02/2021 45.8 37.5 - 51.0 % Final   MCHC  Date Value Ref Range Status  01/02/2021 34.1 31.5 - 35.7 g/dL Final  89/92/7978 66.4 30.0 - 36.0 g/dL Final   [facility]  Date Value Ref Range Status  01/02/2021 31.1 26.6 - 33.0 pg Final  07/18/2020 30.7 26.0 - 34.0 pg Final   MCV  Date Value Ref Range Status  01/02/2021 91 79 - 97 fL Final   No results found for: PLTCOUNTKUC, LABPLAT, POCPLA RDW  Date Value Ref Range Status  01/02/2021 13.1 11.6 - 15.4 % Final         Passed - HBA1C is between 0 and 7.9 and within 180 days    Hgb  A1c MFr Bld  Date Value Ref Range Status  10/28/2023 6.7 (H) 4.8 - 5.6 % Final    Comment:             Prediabetes: 5.7 - 6.4          Diabetes: >6.4          Glycemic control for adults with diabetes: <7.0          Passed - eGFR in normal range and within 360 days    GFR calc Af Amer  Date Value Ref Range Status  09/28/2019 124 >59 mL/min/1.73 Final   GFR calc non Af Amer  Date Value Ref Range Status  07/18/2020 >60 >60 mL/min Final   eGFR  Date Value Ref Range Status  10/28/2023 111 >59 mL/min/1.73 Final

## 2024-05-13 ENCOUNTER — Other Ambulatory Visit: Payer: Self-pay | Admitting: Family Medicine

## 2024-07-13 ENCOUNTER — Other Ambulatory Visit: Payer: Self-pay | Admitting: Family Medicine

## 2024-08-06 ENCOUNTER — Ambulatory Visit
Admission: EM | Admit: 2024-08-06 | Discharge: 2024-08-06 | Disposition: A | Discharge status: Home / Self Care | Attending: Emergency Medicine | Admitting: Emergency Medicine

## 2024-08-06 ENCOUNTER — Encounter: Payer: Self-pay | Admitting: Emergency Medicine

## 2024-08-06 DIAGNOSIS — J0100 Acute maxillary sinusitis, unspecified: Secondary | ICD-10-CM | POA: Diagnosis not present

## 2024-08-06 MED ORDER — AMOXICILLIN-POT CLAVULANATE 875-125 MG PO TABS: 1.0000 | ORAL_TABLET | Freq: Two times a day (BID) | ORAL | 0 refills | Status: DC

## 2024-08-06 NOTE — Discharge Instructions (Addendum)
 Declined avs

## 2024-08-06 NOTE — ED Provider Notes (Signed)
 [facility]    CSN: [id number] Arrival date & time: 08/06/24  1256      History   Chief Complaint Chief Complaint  Patient presents with   Nasal Congestion   Facial Pain    HPI Dillon Bolton. is a 58 y.o. male.   Patient presents for evaluation of nasal congestion, nonproductive cough, sinus pressure to the bilateral cheeks present for 7 days.  Symptoms exacerbated by bending over this also caused pressure to the bilateral ears.  Tolerable to food and liquids.  Has attempted use of NyQuil.  History of reoccurring sinusitis.  Denies fever, shortness of breath or wheezing.  Past Medical History:  Diagnosis Date   Diabetes mellitus without complication (HCC)    Sleep apnea     Patient Active Problem List   Diagnosis Date Noted   New onset of headaches 07/13/2023   History of colonic polyps 12/01/2022   Adenomatous polyp of colon 12/01/2022   Chest pain 09/28/2019   Hypertension associated with diabetes (HCC) 06/16/2019   T2DM (type 2 diabetes mellitus) (HCC) 05/18/2018   Hyperlipidemia associated with type 2 diabetes mellitus (HCC) 05/17/2018   Morbid obesity (HCC) 11/09/2017   Tobacco use disorder 11/09/2017   Psoriasis 11/09/2017   Sleep apnea     Past Surgical History:  Procedure Laterality Date   COLONOSCOPY WITH PROPOFOL  N/A 01/06/2018   Procedure: COLONOSCOPY WITH PROPOFOL ;  Surgeon: Therisa Bi, MD;  Location: [facility] ENDOSCOPY;  Service: Gastroenterology;  Laterality: N/A;   COLONOSCOPY WITH PROPOFOL  N/A 12/01/2022   Procedure: COLONOSCOPY WITH PROPOFOL ;  Surgeon: Therisa Bi, MD;  Location: [facility] ENDOSCOPY;  Service: Gastroenterology;  Laterality: N/A;   CYSTOSCOPY WITH STENT PLACEMENT Left 07/19/2020   Procedure: CYSTOSCOPY WITH STENT PLACEMENT;  Surgeon: Twylla Glendia BROCKS, MD;  Location: [facility] ORS;  Service: Urology;  Laterality: Left;   CYSTOSCOPY/URETEROSCOPY/HOLMIUM LASER/STENT PLACEMENT Left 07/30/2020   Procedure:  CYSTOSCOPY/URETEROSCOPY/HOLMIUM LASER/STENT EXCHANGE;  Surgeon: Twylla Glendia BROCKS, MD;  Location: [facility] ORS;  Service: Urology;  Laterality: Left;   KNEE SURGERY Bilateral 1975   removed osteochondromas at age 21   NOSE SURGERY     to help with OSA, opened up passages   TONSILLECTOMY     WISDOM TOOTH EXTRACTION         Home Medications    Prior to Admission medications   Medication Sig Start Date End Date Taking? Authorizing Provider  amoxicillin -clavulanate (AUGMENTIN ) 875-125 MG tablet Take 1 tablet by mouth every 12 (twelve) hours. 08/06/24  Yes Vanessia Bokhari R, NP  atorvastatin  (LIPITOR) 40 MG tablet TAKE 1 TABLET BY MOUTH EVERY DAY 07/13/24   Bacigalupo, Angela M, MD  JARDIANCE  25 MG TABS tablet TAKE 1 TABLET BY MOUTH EVERY DAY 05/15/24   Bacigalupo, Jon HERO, MD  losartan  (COZAAR ) 25 MG tablet TAKE 1 TABLET (25 MG TOTAL) BY MOUTH DAILY. 05/15/24   Bacigalupo, Angela M, MD  Meloxicam 15 MG TBDP Take 15 mg by mouth daily.     [provider]  metFORMIN  (GLUCOPHAGE ) 1000 MG tablet TAKE 1 TABLET (1,000 MG TOTAL) BY MOUTH TWICE A DAY WITH FOOD 04/25/24   Myrla Jon HERO, MD  Semaglutide , 1 MG/DOSE, (OZEMPIC , 1 MG/DOSE,) 4 MG/3ML SOPN INJECT 1 MG INTO THE SKIN ONE TIME PER WEEK 03/09/24   Bacigalupo, Jon HERO, MD    Family History Family History  Problem Relation Age of Onset   Hypertension Mother    Colon cancer Father    Hypertension Brother    Prostate cancer  Neg Hx     Social History Social History   Tobacco Use   Smoking status: Every Day    Current packs/day: 0.50    Average packs/day: 0.5 packs/day for 38.8 years (19.4 ttl pk-yrs)    Types: Cigarettes    Start date: 65   Smokeless tobacco: Never   Tobacco comments:    started smoking at age 58. Is smoking 0.25-0.75 PPD, which is decreased from 1.5-2 PPD  Vaping Use   Vaping status: Never Used  Substance Use Topics   Alcohol use: Yes    Alcohol/week: 1.0 - 2.0 standard drink of alcohol    Types: 1 - 2  Cans of beer per week    Comment: occasional   Drug use: No     Allergies   Patient has no known allergies.   Review of Systems Review of Systems   Physical Exam Triage Vital Signs ED Triage Vitals  Encounter Vitals Group     BP 08/06/24 1331 133/76     Girls Systolic BP Percentile --      Girls Diastolic BP Percentile --      Boys Systolic BP Percentile --      Boys Diastolic BP Percentile --      Pulse Rate 08/06/24 1331 96     Resp 08/06/24 1331 20     Temp 08/06/24 1331 98.2 F (36.8 C)     Temp Source 08/06/24 1331 Oral     SpO2 08/06/24 1331 96 %     Weight --      Height --      Head Circumference --      Peak Flow --      Pain Score 08/06/24 1334 4     Pain Loc --      Pain Education --      Exclude from Growth Chart --    No data found.  Updated Vital Signs BP 133/76 (BP Location: Left Arm)   Pulse 96   Temp 98.2 F (36.8 C) (Oral)   Resp 20   SpO2 96%   Visual Acuity Right Eye Distance:   Left Eye Distance:   Bilateral Distance:    Right Eye Near:   Left Eye Near:    Bilateral Near:     Physical Exam Constitutional:      Appearance: Normal appearance.  HENT:     Head: Normocephalic.     Right Ear: Tympanic membrane, ear canal and external ear normal.     Left Ear: Tympanic membrane, ear canal and external ear normal.     Nose:     Right Sinus: Maxillary sinus tenderness present.     Left Sinus: Maxillary sinus tenderness present.     Mouth/Throat:     Pharynx: No oropharyngeal exudate or posterior oropharyngeal erythema.  Pulmonary:     Effort: Pulmonary effort is normal.  Neurological:     Mental Status: He is alert and oriented to person, place, and time.      UC Treatments / Results  Labs (all labs ordered are listed, but only abnormal results are displayed) Labs Reviewed - No data to display  EKG   Radiology No results found.  Procedures Procedures (including critical care time)  Medications Ordered in  UC Medications - No data to display  Initial Impression / Assessment and Plan / UC Course  I have reviewed the triage vital signs and the nursing notes.  Pertinent labs & imaging results that were available during my  care of the patient were reviewed by me and considered in my medical decision making (see chart for details).  Acute nonrecurrent maxillary sinusitis  Patient is in no signs of distress nor toxic appearing.  Vital signs are stable.  Low suspicion for pneumonia, pneumothorax or bronchitis and therefore will defer imaging.  Presentation concerning for sinus infection, discussed with patient.  Prescribed Augmentin , declined use of steroids May use additional over-the-counter medications as needed for supportive care.  May follow-up with urgent care as needed if symptoms persist or worsen.  Note given.   Final Clinical Impressions(s) / UC Diagnoses   Final diagnoses:  Acute non-recurrent maxillary sinusitis     Discharge Instructions      Declined avs   ED Prescriptions     Medication Sig Dispense Auth. Provider   amoxicillin -clavulanate (AUGMENTIN ) 875-125 MG tablet Take 1 tablet by mouth every 12 (twelve) hours. 14 tablet Rydan Gulyas R, NP      PDMP not reviewed this encounter.   Teresa Shelba SAUNDERS, NP 08/06/24 1346

## 2024-08-06 NOTE — ED Triage Notes (Signed)
 Patient reports facial pressure, and nasal congestion with green mucus x 7 days. Patient has been taking Nyquil with mild relief. Rates pain 4/10.

## 2024-08-20 ENCOUNTER — Other Ambulatory Visit: Payer: Self-pay | Admitting: Family Medicine

## 2024-08-22 NOTE — Telephone Encounter (Signed)
 Requested medications are due for refill today.  yes  Requested medications are on the active medications list.  yes  Last refill. 03/09/2024 9mL 1 rf  Future visit scheduled.   no  Notes to clinic.  Pt is more than 3 months overdue for an ov.    Requested Prescriptions  Pending Prescriptions Disp Refills   OZEMPIC , 1 MG/DOSE, 4 MG/3ML SOPN [Pharmacy Med Name: OZEMPIC  4 MG/3 ML (1 MG/DOSE)]  5    Sig: INJECT 1 MG INTO THE SKIN ONE TIME PER WEEK     Endocrinology:  Diabetes - GLP-1 Receptor Agonists - semaglutide  Failed - 08/22/2024 10:02 AM      Failed - HBA1C in normal range and within 180 days    Hgb A1c MFr Bld  Date Value Ref Range Status  10/28/2023 6.7 (H) 4.8 - 5.6 % Final    Comment:             Prediabetes: 5.7 - 6.4          Diabetes: >6.4          Glycemic control for adults with diabetes: <7.0          Failed - Cr in normal range and within 360 days    Creatinine, Ser  Date Value Ref Range Status  10/28/2023 0.63 (L) 0.76 - 1.27 mg/dL Final         Failed - Valid encounter within last 6 months    Recent Outpatient Visits   None

## 2024-09-13 ENCOUNTER — Telehealth: Payer: Self-pay | Admitting: Family Medicine

## 2024-09-13 NOTE — Telephone Encounter (Unsigned)
 Copied from CRM [id number]. Topic: Clinical - Medication Refill >> Sep 13, 2024  3:56 PM Delon T wrote: Medication: Semaglutide , 1 MG/DOSE, (OZEMPIC , 1 MG/DOSE,) 4 MG/3ML SOPN  Has the patient contacted their pharmacy? Yes (Agent: If no, request that the patient contact the pharmacy for the refill. If patient does not wish to contact the pharmacy document the reason why and proceed with request.) (Agent: If yes, when and what did the pharmacy advise?)  This is the patient's preferred pharmacy:  [facility]- [address] [place], [place], [place] [address] Open  Closes 9 PM  More hours [phone]  Is this the correct pharmacy for this prescription? Yes If no, delete pharmacy and type the correct one.   Has the prescription been filled recently? Yes  Is the patient out of the medication? Yes  Has the patient been seen for an appointment in the last year OR does the patient have an upcoming appointment? Yes  Can we respond through MyChart? No  Agent: Please be advised that Rx refills may take up to 3 business days. We ask that you follow-up with your pharmacy.

## 2024-09-16 MED ORDER — OZEMPIC (1 MG/DOSE) 4 MG/3ML ~~LOC~~ SOPN: 1.0000 mg | PEN_INJECTOR | SUBCUTANEOUS | 0 refills | Status: DC

## 2024-09-16 NOTE — Telephone Encounter (Signed)
 Requested Prescriptions  Pending Prescriptions Disp Refills   Semaglutide , 1 MG/DOSE, (OZEMPIC , 1 MG/DOSE,) 4 MG/3ML SOPN 9 mL 0    Sig: Inject 1 mg into the skin once a week.     Endocrinology:  Diabetes - GLP-1 Receptor Agonists - semaglutide  Failed - 09/16/2024  8:37 AM      Failed - HBA1C in normal range and within 180 days    Hgb A1c MFr Bld  Date Value Ref Range Status  10/28/2023 6.7 (H) 4.8 - 5.6 % Final    Comment:             Prediabetes: 5.7 - 6.4          Diabetes: >6.4          Glycemic control for adults with diabetes: <7.0          Failed - Cr in normal range and within 360 days    Creatinine, Ser  Date Value Ref Range Status  10/28/2023 0.63 (L) 0.76 - 1.27 mg/dL Final         Failed - Valid encounter within last 6 months    Recent Outpatient Visits   None

## 2024-09-25 ENCOUNTER — Telehealth: Payer: Self-pay | Admitting: Family Medicine

## 2024-09-25 ENCOUNTER — Other Ambulatory Visit: Payer: Self-pay

## 2024-09-25 MED ORDER — ATORVASTATIN CALCIUM 40 MG PO TABS: 40.0000 mg | ORAL_TABLET | Freq: Every day | ORAL | 5 refills | Status: AC

## 2024-09-25 MED ORDER — LOSARTAN POTASSIUM 25 MG PO TABS: 25.0000 mg | ORAL_TABLET | Freq: Every day | ORAL | 4 refills | Status: DC

## 2024-09-25 NOTE — Telephone Encounter (Signed)
 [facility] faxed refill request for the following medications:   losartan  (COZAAR ) 25 MG tablet   Requesting a 90 day supply   Please advise.

## 2024-10-13 ENCOUNTER — Other Ambulatory Visit: Payer: Self-pay | Admitting: Family Medicine

## 2024-10-23 ENCOUNTER — Telehealth: Payer: Self-pay | Admitting: Family Medicine

## 2024-10-23 ENCOUNTER — Other Ambulatory Visit: Payer: Self-pay

## 2024-10-23 MED ORDER — LOSARTAN POTASSIUM 25 MG PO TABS: 25.0000 mg | ORAL_TABLET | Freq: Every day | ORAL | 3 refills | Status: AC

## 2024-10-23 NOTE — Telephone Encounter (Signed)
Medication has been sent into [place]

## 2024-10-23 NOTE — Telephone Encounter (Signed)
[facility] faxed refill request for the following medications:  losartan (COZAAR) 25 MG tablet   Please advise.  

## 2024-10-24 ENCOUNTER — Telehealth: Payer: Self-pay | Admitting: Family Medicine

## 2024-10-24 NOTE — Telephone Encounter (Signed)
 [facility] faxed refill request for the following medications  Semaglutide , 1 MG/DOSE, (OZEMPIC , 1 MG/DOSE,) 4 MG/3ML SOPN   Please advise.

## 2024-10-27 ENCOUNTER — Other Ambulatory Visit: Payer: Self-pay

## 2024-10-27 MED ORDER — OZEMPIC (1 MG/DOSE) 4 MG/3ML ~~LOC~~ SOPN: 1.0000 mg | PEN_INJECTOR | SUBCUTANEOUS | 0 refills | Status: AC

## 2024-10-27 NOTE — Telephone Encounter (Signed)
 Medication was sent in enough until next appt.

## 2024-10-31 ENCOUNTER — Ambulatory Visit: Admitting: Family Medicine

## 2024-10-31 ENCOUNTER — Encounter: Payer: Self-pay | Admitting: Family Medicine

## 2024-10-31 VITALS — BP 124/65 | HR 85 | Temp 97.7°F | Wt 298.1 lb

## 2024-10-31 DIAGNOSIS — E785 Hyperlipidemia, unspecified: Secondary | ICD-10-CM

## 2024-10-31 DIAGNOSIS — Z23 Encounter for immunization: Secondary | ICD-10-CM | POA: Diagnosis not present

## 2024-10-31 DIAGNOSIS — F17200 Nicotine dependence, unspecified, uncomplicated: Secondary | ICD-10-CM

## 2024-10-31 DIAGNOSIS — M25511 Pain in right shoulder: Secondary | ICD-10-CM

## 2024-10-31 DIAGNOSIS — I152 Hypertension secondary to endocrine disorders: Secondary | ICD-10-CM | POA: Diagnosis not present

## 2024-10-31 DIAGNOSIS — Z6841 Body Mass Index (BMI) 40.0 and over, adult: Secondary | ICD-10-CM

## 2024-10-31 DIAGNOSIS — E1169 Type 2 diabetes mellitus with other specified complication: Secondary | ICD-10-CM

## 2024-10-31 DIAGNOSIS — K5903 Drug induced constipation: Secondary | ICD-10-CM

## 2024-10-31 DIAGNOSIS — Z7984 Long term (current) use of oral hypoglycemic drugs: Secondary | ICD-10-CM | POA: Diagnosis not present

## 2024-10-31 DIAGNOSIS — Z789 Other specified health status: Secondary | ICD-10-CM

## 2024-10-31 DIAGNOSIS — E1159 Type 2 diabetes mellitus with other circulatory complications: Secondary | ICD-10-CM

## 2024-10-31 DIAGNOSIS — Z0000 Encounter for general adult medical examination without abnormal findings: Secondary | ICD-10-CM | POA: Diagnosis not present

## 2024-10-31 NOTE — Progress Notes (Signed)
 "    Complete physical exam   Patient: Dillon Henry O'Bryan Jr.   DOB: 1965/12/10   59 y.o. Male  MRN: [id number] Visit Date: 10/31/2024  Today's healthcare provider: Jon Eva, MD   Chief Complaint  Patient presents with   Annual Exam    Medication    Subjective    Dillon Brueggemann O'Bryan Jr. is a 59 y.o. male who presents today for a complete physical exam.   Discussed the use of AI scribe software for clinical note transcription with the patient, who gave verbal consent to proceed.  History of Present Illness   Dillon Bolton. is a 59 year old male who presents for an annual physical exam.  He has had right shoulder pain for 1 to 1.5 months, described as soreness and aching, worse with lifting in certain directions. He does not recall a specific injury.  He has had constipation for the last couple of months with very hard stools. Prune juice and prunes give partial relief. His wife feels Ozempic  may contribute. He has been on Ozempic  for some time, but the constipation is recent. He missed two weeks of Ozempic  due to pharmacy issues and plans to restart it this weekend.  His medications are metformin  1000 mg twice a day, losartan  25 mg daily, Jardiance  25 mg daily, and atorvastatin  40 mg daily. Recent pharmacy changes after [facility] stopped taking his insurance caused intermittent gaps in some medications. He recently switched to [facility] and has obtained most prescriptions. He still has a backlog of metformin .  He notes an old finger injury with limited flexion but preserved tool use and grip, which does not affect daily function.  He is worried about job loss when his workplace closes in April 2027. He will receive ten months of severance and is considering future employment and insurance coverage options.       Last depression screening scores    10/31/2024    1:50 PM 07/13/2023    3:27 PM 01/07/2023   11:05 AM  PHQ 2/9 Scores  PHQ - 2 Score 0 0 0  PHQ- 9 Score 2 3   2       Data saved with a previous flowsheet row definition   Last fall risk screening    07/13/2023    3:27 PM  Fall Risk   Falls in the past year? 1  Number falls in past yr: 0  Injury with Fall? 0   Risk for fall due to : No Fall Risks     Data saved with a previous flowsheet row definition        Medications: Show/hide medication list[1]  Review of Systems    Objective    BP 124/65   Pulse 85   Temp 97.7 F (36.5 C) (Oral)   Wt 298 lb 1.6 oz (135.2 kg)   BMI 40.43 kg/m    Physical Exam Vitals reviewed.  Constitutional:      General: He is not in acute distress.    Appearance: Normal appearance. He is well-developed. He is not diaphoretic.  HENT:     Head: Normocephalic and atraumatic.     Right Ear: External ear normal.     Left Ear: External ear normal.     Nose: Nose normal.     Mouth/Throat:     Mouth: Mucous membranes are moist.     Pharynx: Oropharynx is clear. No oropharyngeal exudate.  Eyes:     General: No scleral icterus.    Conjunctiva/sclera:  Conjunctivae normal.     Pupils: Pupils are equal, round, and reactive to light.  Neck:     Thyroid: No thyromegaly.  Cardiovascular:     Rate and Rhythm: Normal rate and regular rhythm.     Heart sounds: Normal heart sounds. No murmur heard. Pulmonary:     Effort: Pulmonary effort is normal. No respiratory distress.     Breath sounds: Normal breath sounds. No wheezing or rales.  Abdominal:     General: There is no distension.     Palpations: Abdomen is soft.     Tenderness: There is no abdominal tenderness.  Musculoskeletal:        General: No deformity.     Cervical back: Neck supple.     Right lower leg: No edema.     Left lower leg: No edema.  Lymphadenopathy:     Cervical: No cervical adenopathy.  Skin:    General: Skin is warm and dry.     Findings: No rash.  Neurological:     Mental Status: He is alert and oriented to person, place, and time. Mental status is at baseline.      Gait: Gait normal.  Psychiatric:        Mood and Affect: Mood normal.        Behavior: Behavior normal.        Thought Content: Thought content normal.      No results found for any visits on 10/31/24.  Assessment & Plan    Routine Health Maintenance and Physical Exam  Exercise Activities and Dietary recommendations  Goals   None     Immunization History  Administered Date(s) Administered   Influenza Inj Mdck Quad Pf 08/04/2018   Influenza Split 08/04/2015   Influenza, Seasonal, Injecte, Preservative Fre 07/13/2023   Influenza,inj,Quad PF,6+ Mos 11/09/2017, 06/16/2019, 08/25/2020, 08/04/2021, 07/17/2022   Moderna Sars-Covid-2 Vaccination 02/19/2020, 03/18/2020   PNEUMOCOCCAL CONJUGATE-20 10/28/2023   Pneumococcal Polysaccharide-23 05/17/2018   Tdap 11/09/2017   Zoster Recombinant(Shingrix ) 09/28/2019, 01/02/2021    Health Maintenance  Topic Date Due   HIV Screening  Never done   Hepatitis B Vaccines 19-59 Average Risk (1 of 3 - 19+ 3-dose series) Never done   HEMOGLOBIN A1C  04/26/2024   OPHTHALMOLOGY EXAM  05/25/2024   COVID-19 Vaccine (3 - 2025-26 season) 06/12/2024   Diabetic kidney evaluation - Urine ACR  07/12/2024   Diabetic kidney evaluation - eGFR measurement  10/27/2024   FOOT EXAM  10/27/2024   Influenza Vaccine  01/09/2025 (Originally 05/12/2024)   Colonoscopy  12/01/2025   DTaP/Tdap/Td (2 - Td or Tdap) 11/10/2027   Pneumococcal Vaccine: 50+ Years  Completed   HPV VACCINES (No Doses Required) Completed   Hepatitis C Screening  Completed   Zoster Vaccines- Shingrix   Completed   Meningococcal B Vaccine  Aged Out    Discussed health benefits of physical activity, and encouraged him to engage in regular exercise appropriate for his age and condition.  Problem List Items Addressed This Visit       Cardiovascular and Mediastinum   Hypertension associated with diabetes (HCC)   Type 2 diabetes mellitus managed with metformin , Ozempic , and Jardiance .  Hypertension managed with losartan . Hyperlipidemia managed with atorvastatin . Blood pressure is well-controlled. Recent medication supply issues due to pharmacy changes, but medications have been obtained. A1c and cholesterol levels to be checked. [facility] exam records are pending [facility]. - Continue metformin  1000 mg twice daily - Continue Ozempic  1 mg weekly - Continue Jardiance  25 mg  daily - Continue losartan  25 mg daily - Continue atorvastatin  40 mg daily - Ordered A1c and cholesterol tests - Requested eye exam records from [facility] - Administered flu shot - Ordered hepatitis B immunity test - Ordered HIV screening      Relevant Orders   Comprehensive metabolic panel with GFR     Endocrine   Hyperlipidemia associated with type 2 diabetes mellitus (HCC)   Type 2 diabetes mellitus managed with metformin , Ozempic , and Jardiance . Hypertension managed with losartan . Hyperlipidemia managed with atorvastatin . Blood pressure is well-controlled. Recent medication supply issues due to pharmacy changes, but medications have been obtained. A1c and cholesterol levels to be checked. Eye exam records are pending from [facility]. - Continue metformin  1000 mg twice daily - Continue Ozempic  1 mg weekly - Continue Jardiance  25 mg daily - Continue losartan  25 mg daily - Continue atorvastatin  40 mg daily - Ordered A1c and cholesterol tests - Requested eye exam records from [facility] - Administered flu shot - Ordered hepatitis B immunity test - Ordered HIV screening      Relevant Orders   Comprehensive metabolic panel with GFR   Lipid panel   T2DM (type 2 diabetes mellitus) (HCC)   Type 2 diabetes mellitus managed with metformin , Ozempic , and Jardiance . Hypertension managed with losartan . Hyperlipidemia managed with atorvastatin . Blood pressure is well-controlled. Recent medication supply issues due to pharmacy changes, but medications have been obtained. A1c and cholesterol levels to  be checked. Eye exam records are pending from [facility]. - Continue metformin  1000 mg twice daily - Continue Ozempic  1 mg weekly - Continue Jardiance  25 mg daily - Continue losartan  25 mg daily - Continue atorvastatin  40 mg daily - Ordered A1c and cholesterol tests - Requested eye exam records from [facility] - Administered flu shot - Ordered hepatitis B immunity test - Ordered HIV screening      Relevant Orders   Urine Microalbumin w/creat. ratio   Comprehensive metabolic panel with GFR   Hemoglobin A1c   Lipid panel     Other   Morbid obesity (HCC)   Managed with Ozempic . Recent weight gain due to missed doses of Ozempic . Resuming Ozempic  this weekend to maintain schedule. - Continue Ozempic  1 mg weekly      Tobacco use disorder   Encourage cessation      Other Visit Diagnoses       Encounter for annual physical exam    -  Primary     Hepatitis B vaccination status unknown       Relevant Orders   Hepatitis B Surface AntiBODY     Immunization due       Relevant Orders   Flu vaccine trivalent PF, 6mos and older(Flulaval,Afluria,Fluarix,Fluzone)     Drug-induced constipation         Acute pain of right shoulder               Drug-induced constipation Likely secondary to Ozempic  use. Symptoms include hard stools. Prune juice and prunes have been used with some effect. Miralax recommended for daily use to manage constipation. - Recommended Miralax daily, starting with one capful and adjusting as needed  Acute right shoulder pain Last month to month and a half. No history of injury or loss of motion. Strength is intact. Likely a strain with no evidence of rotator cuff injury or impingement. - Advised rest and ice application for shoulder pain        Return in about 6 months (around 04/30/2025) for  chronic disease f/u.     Jon Eva, MD  [facility] [phone] (phone) [phone] (fax)  [facility]      [1]  Outpatient Medications Prior to Visit  Medication Sig   atorvastatin  (LIPITOR) 40 MG tablet Take 1 tablet (40 mg total) by mouth daily.   JARDIANCE  25 MG TABS tablet TAKE 1 TABLET BY MOUTH EVERY DAY   losartan  (COZAAR ) 25 MG tablet Take 1 tablet (25 mg total) by mouth daily.   metFORMIN  (GLUCOPHAGE ) 1000 MG tablet TAKE 1 TABLET (1,000 MG TOTAL) BY MOUTH TWICE A DAY WITH FOOD   Semaglutide , 1 MG/DOSE, (OZEMPIC , 1 MG/DOSE,) 4 MG/3ML SOPN Inject 1 mg into the skin once a week.   [DISCONTINUED] amoxicillin -clavulanate (AUGMENTIN ) 875-125 MG tablet Take 1 tablet by mouth every 12 (twelve) hours. (Patient not taking: Reported on 10/31/2024)   [DISCONTINUED] Meloxicam 15 MG TBDP Take 15 mg by mouth daily.  (Patient not taking: Reported on 10/31/2024)   No facility-administered medications prior to visit.   "

## 2024-10-31 NOTE — Assessment & Plan Note (Signed)
 Type 2 diabetes mellitus managed with metformin , Ozempic , and Jardiance . Hypertension managed with losartan . Hyperlipidemia managed with atorvastatin . Blood pressure is well-controlled. Recent medication supply issues due to pharmacy changes, but medications have been obtained. A1c and cholesterol levels to be checked. Eye exam records are pending from [facility]. - Continue metformin  1000 mg twice daily - Continue Ozempic  1 mg weekly - Continue Jardiance  25 mg daily - Continue losartan  25 mg daily - Continue atorvastatin  40 mg daily - Ordered A1c and cholesterol tests - Requested eye exam records from [facility] - Administered flu shot - Ordered hepatitis B immunity test - Ordered HIV screening

## 2024-10-31 NOTE — Assessment & Plan Note (Signed)
 Encourage cessation

## 2024-10-31 NOTE — Assessment & Plan Note (Signed)
 Managed with Ozempic . Recent weight gain due to missed doses of Ozempic . Resuming Ozempic  this weekend to maintain schedule. - Continue Ozempic  1 mg weekly

## 2024-11-02 ENCOUNTER — Ambulatory Visit: Payer: Self-pay | Admitting: Family Medicine

## 2024-11-02 LAB — COMPREHENSIVE METABOLIC PANEL WITH GFR
ALT: 42 IU/L (ref 0–44)
AST: 21 IU/L (ref 0–40)
Albumin: 4.4 g/dL (ref 3.8–4.9)
Alkaline Phosphatase: 86 IU/L (ref 47–123)
BUN/Creatinine Ratio: 18 (ref 9–20)
BUN: 13 mg/dL (ref 6–24)
Bilirubin Total: 0.3 mg/dL (ref 0.0–1.2)
CO2: 18 mmol/L — ABNORMAL LOW (ref 20–29)
Chloride: 109 mmol/L — ABNORMAL HIGH (ref 96–106)
Creatinine, Ser: 0.72 mg/dL — ABNORMAL LOW (ref 0.76–1.27)
Globulin, Total: 2.6 g/dL (ref 1.5–4.5)
Glucose: 110 mg/dL — ABNORMAL HIGH (ref 70–99)
Potassium: 4.2 mmol/L (ref 3.5–5.2)
Sodium: 144 mmol/L (ref 134–144)
Total Protein: 7 g/dL (ref 6.0–8.5)
eGFR: 106 mL/min/1.73

## 2024-11-02 LAB — LIPID PANEL
Chol/HDL Ratio: 3.2 ratio (ref 0.0–5.0)
Cholesterol, Total: 141 mg/dL (ref 100–199)
HDL: 44 mg/dL
LDL Chol Calc (NIH): 79 mg/dL (ref 0–99)
Triglycerides: 99 mg/dL (ref 0–149)
VLDL Cholesterol Cal: 18 mg/dL (ref 5–40)

## 2024-11-02 LAB — HEMOGLOBIN A1C
Est. average glucose Bld gHb Est-mCnc: 134 mg/dL
Hgb A1c MFr Bld: 6.3 % — ABNORMAL HIGH (ref 4.8–5.6)

## 2024-11-02 LAB — MICROALBUMIN / CREATININE URINE RATIO
Creatinine, Urine: 51.1 mg/dL
Microalb/Creat Ratio: 17 mg/g{creat} (ref 0–29)
Microalbumin, Urine: 8.7 ug/mL

## 2024-11-02 LAB — HEPATITIS B SURFACE ANTIBODY,QUALITATIVE: Hep B Surface Ab, Qual: NONREACTIVE

## 2024-11-03 NOTE — Telephone Encounter (Signed)
 Copied from CRM #[id number]. Topic: Clinical - Lab/Test Results >> Nov 03, 2024  1:40 PM Montie POUR wrote: Reason for CRM:  I gave Mr. O'Bryan his lab results dated 11/02/24. He has no questions or concern. He will check his schedule and call back to schedule his nurse visit

## 2025-04-30 ENCOUNTER — Ambulatory Visit: Admitting: Family Medicine
